# Patient Record
Sex: Female | Born: 1970 | Race: Black or African American | Hispanic: No | Marital: Single | Smoking: Current every day smoker
Health system: Southern US, Community
[De-identification: ages and names within clinical notes are randomized; demographics above are authoritative.]

## PROBLEM LIST (undated history)

## (undated) DIAGNOSIS — G8929 Other chronic pain: Secondary | ICD-10-CM

## (undated) DIAGNOSIS — F419 Anxiety disorder, unspecified: Secondary | ICD-10-CM

## (undated) HISTORY — PX: ANKLE SURGERY: SHX546

---

## 2000-12-31 ENCOUNTER — Encounter: Payer: Self-pay | Admitting: *Deleted

## 2000-12-31 ENCOUNTER — Emergency Department (HOSPITAL_COMMUNITY): Admission: EM | Admit: 2000-12-31 | Discharge: 2000-12-31 | Payer: Self-pay | Admitting: *Deleted

## 2001-05-19 ENCOUNTER — Encounter: Payer: Self-pay | Admitting: Emergency Medicine

## 2001-05-19 ENCOUNTER — Emergency Department (HOSPITAL_COMMUNITY): Admission: EM | Admit: 2001-05-19 | Discharge: 2001-05-20 | Payer: Self-pay | Admitting: Emergency Medicine

## 2001-05-21 ENCOUNTER — Emergency Department (HOSPITAL_COMMUNITY): Admission: EM | Admit: 2001-05-21 | Discharge: 2001-05-21 | Payer: Self-pay | Admitting: Emergency Medicine

## 2001-06-11 ENCOUNTER — Emergency Department (HOSPITAL_COMMUNITY): Admission: EM | Admit: 2001-06-11 | Discharge: 2001-06-11 | Payer: Self-pay | Admitting: Emergency Medicine

## 2002-01-03 ENCOUNTER — Emergency Department (HOSPITAL_COMMUNITY): Admission: EM | Admit: 2002-01-03 | Discharge: 2002-01-03 | Payer: Self-pay

## 2002-03-12 ENCOUNTER — Emergency Department (HOSPITAL_COMMUNITY): Admission: EM | Admit: 2002-03-12 | Discharge: 2002-03-12 | Payer: Self-pay | Admitting: Emergency Medicine

## 2002-03-20 ENCOUNTER — Emergency Department (HOSPITAL_COMMUNITY): Admission: EM | Admit: 2002-03-20 | Discharge: 2002-03-20 | Payer: Self-pay | Admitting: *Deleted

## 2002-07-23 ENCOUNTER — Emergency Department (HOSPITAL_COMMUNITY): Admission: EM | Admit: 2002-07-23 | Discharge: 2002-07-23 | Payer: Self-pay | Admitting: Emergency Medicine

## 2002-07-24 ENCOUNTER — Emergency Department (HOSPITAL_COMMUNITY): Admission: EM | Admit: 2002-07-24 | Discharge: 2002-07-24 | Payer: Self-pay | Admitting: Emergency Medicine

## 2002-08-02 ENCOUNTER — Encounter: Payer: Self-pay | Admitting: *Deleted

## 2002-08-02 ENCOUNTER — Emergency Department (HOSPITAL_COMMUNITY): Admission: EM | Admit: 2002-08-02 | Discharge: 2002-08-02 | Payer: Self-pay | Admitting: *Deleted

## 2002-08-08 ENCOUNTER — Emergency Department (HOSPITAL_COMMUNITY): Admission: EM | Admit: 2002-08-08 | Discharge: 2002-08-08 | Payer: Self-pay | Admitting: Emergency Medicine

## 2002-08-29 ENCOUNTER — Emergency Department (HOSPITAL_COMMUNITY): Admission: EM | Admit: 2002-08-29 | Discharge: 2002-08-29 | Payer: Self-pay | Admitting: Internal Medicine

## 2002-09-28 ENCOUNTER — Inpatient Hospital Stay (HOSPITAL_COMMUNITY): Admission: EM | Admit: 2002-09-28 | Discharge: 2002-09-30 | Payer: Self-pay | Admitting: *Deleted

## 2002-09-28 ENCOUNTER — Encounter: Payer: Self-pay | Admitting: Internal Medicine

## 2002-09-30 ENCOUNTER — Inpatient Hospital Stay (HOSPITAL_COMMUNITY): Admission: EM | Admit: 2002-09-30 | Discharge: 2002-10-01 | Payer: Self-pay | Admitting: *Deleted

## 2002-10-04 ENCOUNTER — Emergency Department (HOSPITAL_COMMUNITY): Admission: EM | Admit: 2002-10-04 | Discharge: 2002-10-04 | Payer: Self-pay | Admitting: Emergency Medicine

## 2002-10-04 ENCOUNTER — Encounter: Payer: Self-pay | Admitting: Emergency Medicine

## 2002-10-23 ENCOUNTER — Emergency Department (HOSPITAL_COMMUNITY): Admission: EM | Admit: 2002-10-23 | Discharge: 2002-10-23 | Payer: Self-pay | Admitting: Emergency Medicine

## 2003-02-09 ENCOUNTER — Emergency Department (HOSPITAL_COMMUNITY): Admission: EM | Admit: 2003-02-09 | Discharge: 2003-02-09 | Payer: Self-pay | Admitting: Emergency Medicine

## 2003-02-16 ENCOUNTER — Emergency Department (HOSPITAL_COMMUNITY): Admission: EM | Admit: 2003-02-16 | Discharge: 2003-02-16 | Payer: Self-pay | Admitting: Emergency Medicine

## 2003-03-24 ENCOUNTER — Emergency Department (HOSPITAL_COMMUNITY): Admission: EM | Admit: 2003-03-24 | Discharge: 2003-03-25 | Payer: Self-pay | Admitting: *Deleted

## 2003-03-29 ENCOUNTER — Emergency Department (HOSPITAL_COMMUNITY): Admission: EM | Admit: 2003-03-29 | Discharge: 2003-03-29 | Payer: Self-pay | Admitting: Internal Medicine

## 2003-04-04 ENCOUNTER — Emergency Department (HOSPITAL_COMMUNITY): Admission: EM | Admit: 2003-04-04 | Discharge: 2003-04-05 | Payer: Self-pay | Admitting: Emergency Medicine

## 2003-05-09 ENCOUNTER — Emergency Department (HOSPITAL_COMMUNITY): Admission: EM | Admit: 2003-05-09 | Discharge: 2003-05-09 | Payer: Self-pay | Admitting: Emergency Medicine

## 2003-05-20 ENCOUNTER — Emergency Department (HOSPITAL_COMMUNITY): Admission: EM | Admit: 2003-05-20 | Discharge: 2003-05-20 | Payer: Self-pay | Admitting: Emergency Medicine

## 2003-06-01 ENCOUNTER — Emergency Department (HOSPITAL_COMMUNITY): Admission: EM | Admit: 2003-06-01 | Discharge: 2003-06-01 | Payer: Self-pay | Admitting: Emergency Medicine

## 2003-06-09 ENCOUNTER — Emergency Department (HOSPITAL_COMMUNITY): Admission: EM | Admit: 2003-06-09 | Discharge: 2003-06-09 | Payer: Self-pay | Admitting: Emergency Medicine

## 2003-06-11 ENCOUNTER — Emergency Department (HOSPITAL_COMMUNITY): Admission: EM | Admit: 2003-06-11 | Discharge: 2003-06-12 | Payer: Self-pay | Admitting: Emergency Medicine

## 2003-09-29 ENCOUNTER — Emergency Department (HOSPITAL_COMMUNITY): Admission: EM | Admit: 2003-09-29 | Discharge: 2003-09-29 | Payer: Self-pay | Admitting: *Deleted

## 2004-07-06 ENCOUNTER — Emergency Department (HOSPITAL_COMMUNITY): Admission: EM | Admit: 2004-07-06 | Discharge: 2004-07-06 | Payer: Self-pay | Admitting: Emergency Medicine

## 2004-11-20 ENCOUNTER — Emergency Department (HOSPITAL_COMMUNITY): Admission: EM | Admit: 2004-11-20 | Discharge: 2004-11-20 | Payer: Self-pay | Admitting: Emergency Medicine

## 2004-12-22 ENCOUNTER — Emergency Department (HOSPITAL_COMMUNITY): Admission: EM | Admit: 2004-12-22 | Discharge: 2004-12-22 | Payer: Self-pay | Admitting: Emergency Medicine

## 2004-12-31 ENCOUNTER — Emergency Department (HOSPITAL_COMMUNITY): Admission: EM | Admit: 2004-12-31 | Discharge: 2004-12-31 | Payer: Self-pay | Admitting: Emergency Medicine

## 2005-01-08 ENCOUNTER — Emergency Department (HOSPITAL_COMMUNITY): Admission: EM | Admit: 2005-01-08 | Discharge: 2005-01-08 | Payer: Self-pay | Admitting: Emergency Medicine

## 2005-09-24 ENCOUNTER — Emergency Department (HOSPITAL_COMMUNITY): Admission: EM | Admit: 2005-09-24 | Discharge: 2005-09-24 | Payer: Self-pay | Admitting: Emergency Medicine

## 2005-11-06 ENCOUNTER — Emergency Department (HOSPITAL_COMMUNITY): Admission: EM | Admit: 2005-11-06 | Discharge: 2005-11-06 | Payer: Self-pay | Admitting: Emergency Medicine

## 2006-04-20 ENCOUNTER — Emergency Department (HOSPITAL_COMMUNITY): Admission: EM | Admit: 2006-04-20 | Discharge: 2006-04-21 | Payer: Self-pay | Admitting: Emergency Medicine

## 2006-10-11 ENCOUNTER — Inpatient Hospital Stay (HOSPITAL_COMMUNITY): Admission: EM | Admit: 2006-10-11 | Discharge: 2006-10-13 | Payer: Self-pay | Admitting: *Deleted

## 2006-10-19 ENCOUNTER — Emergency Department (HOSPITAL_COMMUNITY): Admission: EM | Admit: 2006-10-19 | Discharge: 2006-10-19 | Payer: Self-pay | Admitting: Emergency Medicine

## 2006-12-04 ENCOUNTER — Encounter (HOSPITAL_COMMUNITY): Admission: RE | Admit: 2006-12-04 | Discharge: 2007-01-03 | Payer: Self-pay | Admitting: Orthopaedic Surgery

## 2007-01-13 ENCOUNTER — Emergency Department (HOSPITAL_COMMUNITY): Admission: EM | Admit: 2007-01-13 | Discharge: 2007-01-13 | Payer: Self-pay | Admitting: Emergency Medicine

## 2007-04-24 ENCOUNTER — Emergency Department (HOSPITAL_COMMUNITY): Admission: EM | Admit: 2007-04-24 | Discharge: 2007-04-24 | Payer: Self-pay | Admitting: Emergency Medicine

## 2007-04-29 ENCOUNTER — Emergency Department (HOSPITAL_COMMUNITY): Admission: EM | Admit: 2007-04-29 | Discharge: 2007-04-29 | Payer: Self-pay | Admitting: Emergency Medicine

## 2007-05-07 ENCOUNTER — Inpatient Hospital Stay (HOSPITAL_COMMUNITY): Admission: RE | Admit: 2007-05-07 | Discharge: 2007-05-11 | Payer: Self-pay | Admitting: *Deleted

## 2007-05-07 ENCOUNTER — Ambulatory Visit: Payer: Self-pay | Admitting: *Deleted

## 2007-05-07 ENCOUNTER — Emergency Department (HOSPITAL_COMMUNITY): Admission: EM | Admit: 2007-05-07 | Discharge: 2007-05-07 | Payer: Self-pay | Admitting: Emergency Medicine

## 2007-05-16 ENCOUNTER — Emergency Department (HOSPITAL_COMMUNITY): Admission: EM | Admit: 2007-05-16 | Discharge: 2007-05-16 | Payer: Self-pay | Admitting: Emergency Medicine

## 2007-05-18 ENCOUNTER — Emergency Department (HOSPITAL_COMMUNITY): Admission: EM | Admit: 2007-05-18 | Discharge: 2007-05-18 | Payer: Self-pay | Admitting: Emergency Medicine

## 2007-05-19 ENCOUNTER — Inpatient Hospital Stay (HOSPITAL_COMMUNITY): Admission: AD | Admit: 2007-05-19 | Discharge: 2007-05-19 | Payer: Self-pay | Admitting: Obstetrics and Gynecology

## 2007-06-02 ENCOUNTER — Emergency Department (HOSPITAL_COMMUNITY): Admission: EM | Admit: 2007-06-02 | Discharge: 2007-06-03 | Payer: Self-pay | Admitting: Emergency Medicine

## 2007-06-08 ENCOUNTER — Emergency Department (HOSPITAL_COMMUNITY): Admission: EM | Admit: 2007-06-08 | Discharge: 2007-06-08 | Payer: Self-pay | Admitting: Emergency Medicine

## 2007-09-30 ENCOUNTER — Emergency Department: Payer: Self-pay | Admitting: Emergency Medicine

## 2007-10-01 ENCOUNTER — Emergency Department (HOSPITAL_COMMUNITY): Admission: EM | Admit: 2007-10-01 | Discharge: 2007-10-01 | Payer: Self-pay | Admitting: Emergency Medicine

## 2008-01-14 ENCOUNTER — Emergency Department: Payer: Self-pay | Admitting: Emergency Medicine

## 2008-03-11 ENCOUNTER — Emergency Department (HOSPITAL_COMMUNITY): Admission: EM | Admit: 2008-03-11 | Discharge: 2008-03-11 | Payer: Self-pay | Admitting: Emergency Medicine

## 2008-03-31 ENCOUNTER — Emergency Department (HOSPITAL_COMMUNITY): Admission: EM | Admit: 2008-03-31 | Discharge: 2008-03-31 | Payer: Self-pay | Admitting: Emergency Medicine

## 2008-06-14 ENCOUNTER — Emergency Department: Payer: Self-pay | Admitting: Emergency Medicine

## 2008-08-26 ENCOUNTER — Emergency Department (HOSPITAL_COMMUNITY): Admission: EM | Admit: 2008-08-26 | Discharge: 2008-08-26 | Payer: Self-pay | Admitting: Emergency Medicine

## 2009-01-26 ENCOUNTER — Emergency Department (HOSPITAL_COMMUNITY): Admission: EM | Admit: 2009-01-26 | Discharge: 2009-01-26 | Payer: Self-pay | Admitting: Emergency Medicine

## 2009-04-04 ENCOUNTER — Emergency Department (HOSPITAL_COMMUNITY): Admission: EM | Admit: 2009-04-04 | Discharge: 2009-04-04 | Payer: Self-pay | Admitting: Emergency Medicine

## 2009-07-23 ENCOUNTER — Emergency Department (HOSPITAL_COMMUNITY): Admission: EM | Admit: 2009-07-23 | Discharge: 2009-07-23 | Payer: Self-pay | Admitting: Emergency Medicine

## 2009-08-18 ENCOUNTER — Emergency Department (HOSPITAL_COMMUNITY): Admission: EM | Admit: 2009-08-18 | Discharge: 2009-08-18 | Payer: Self-pay | Admitting: Emergency Medicine

## 2009-08-23 ENCOUNTER — Emergency Department (HOSPITAL_COMMUNITY): Admission: EM | Admit: 2009-08-23 | Discharge: 2009-08-23 | Payer: Self-pay | Admitting: Emergency Medicine

## 2009-12-08 IMAGING — CR DG CHEST 2V
2 series · 2 of 2 positions shown · non-contrast
Comparison: 10/11/2006

CLINICAL DATA: Cough, chest pain, shortness of breath

CHEST - 2 VIEW:

[w chest pa]
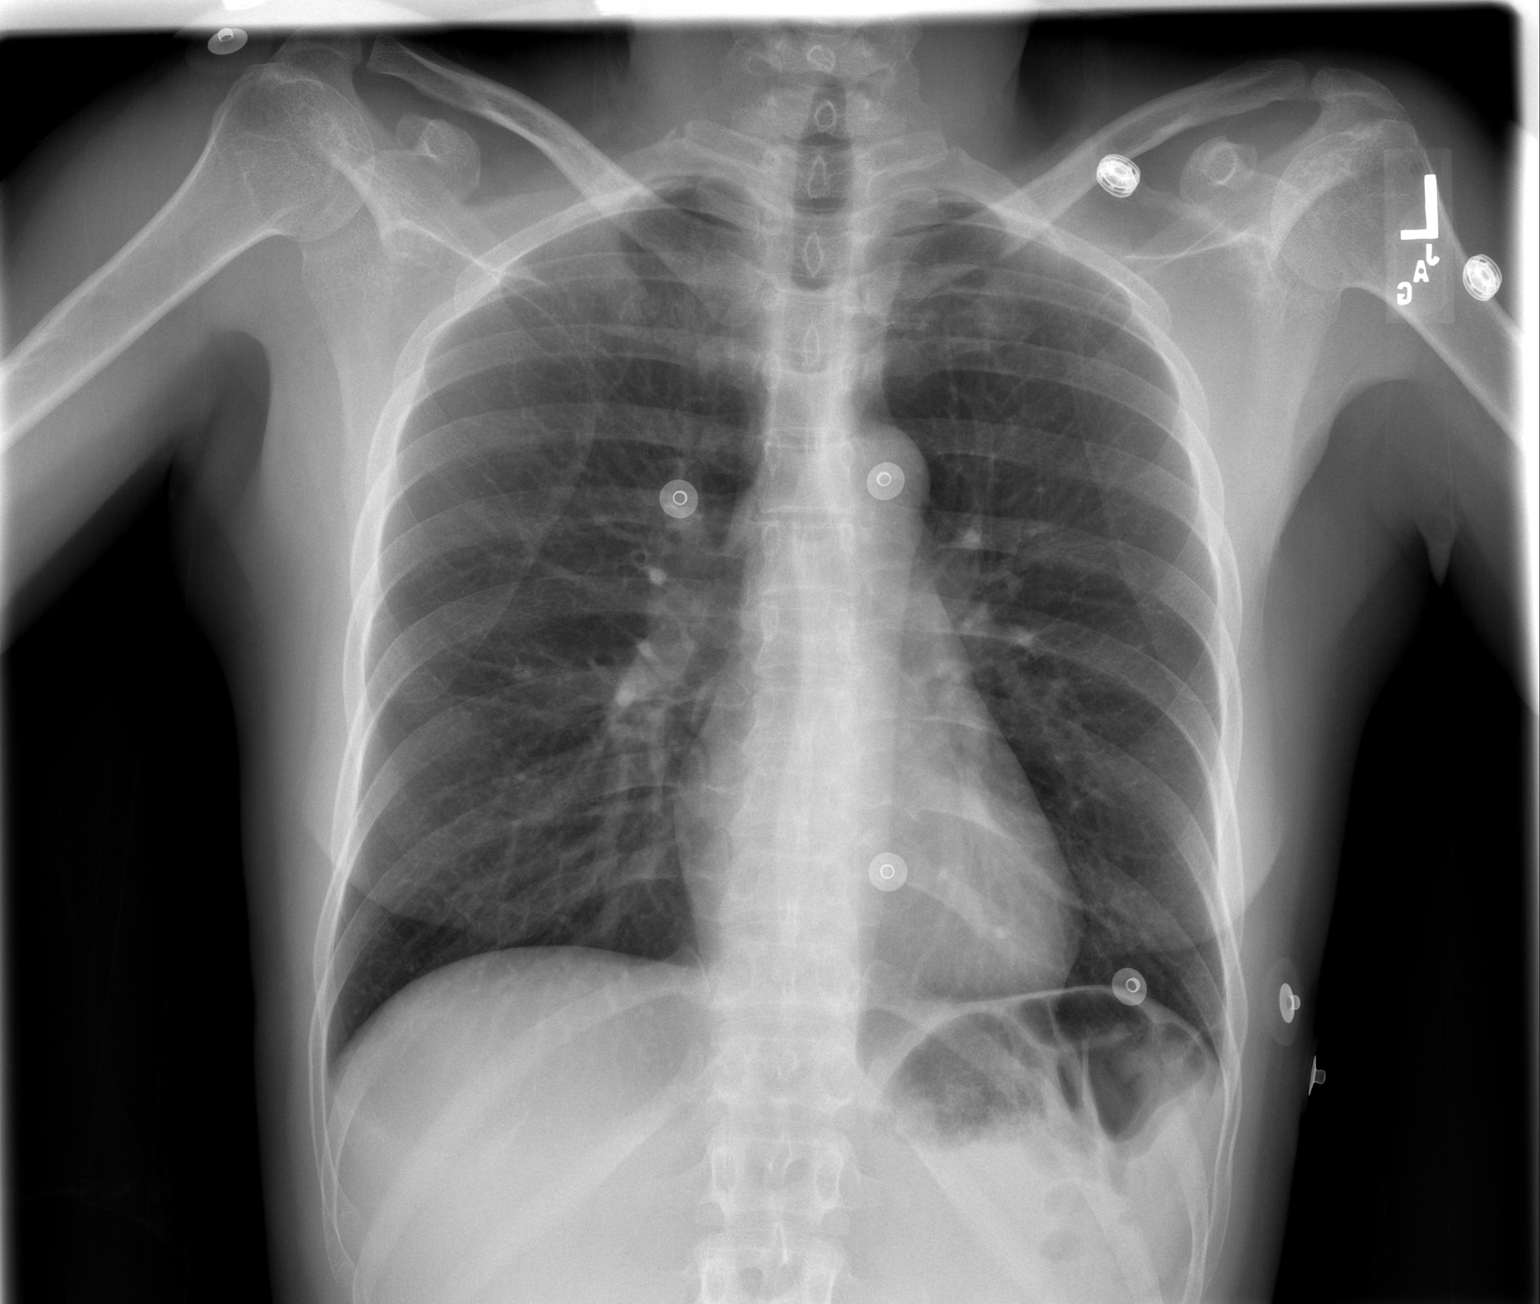

[w chest lat]
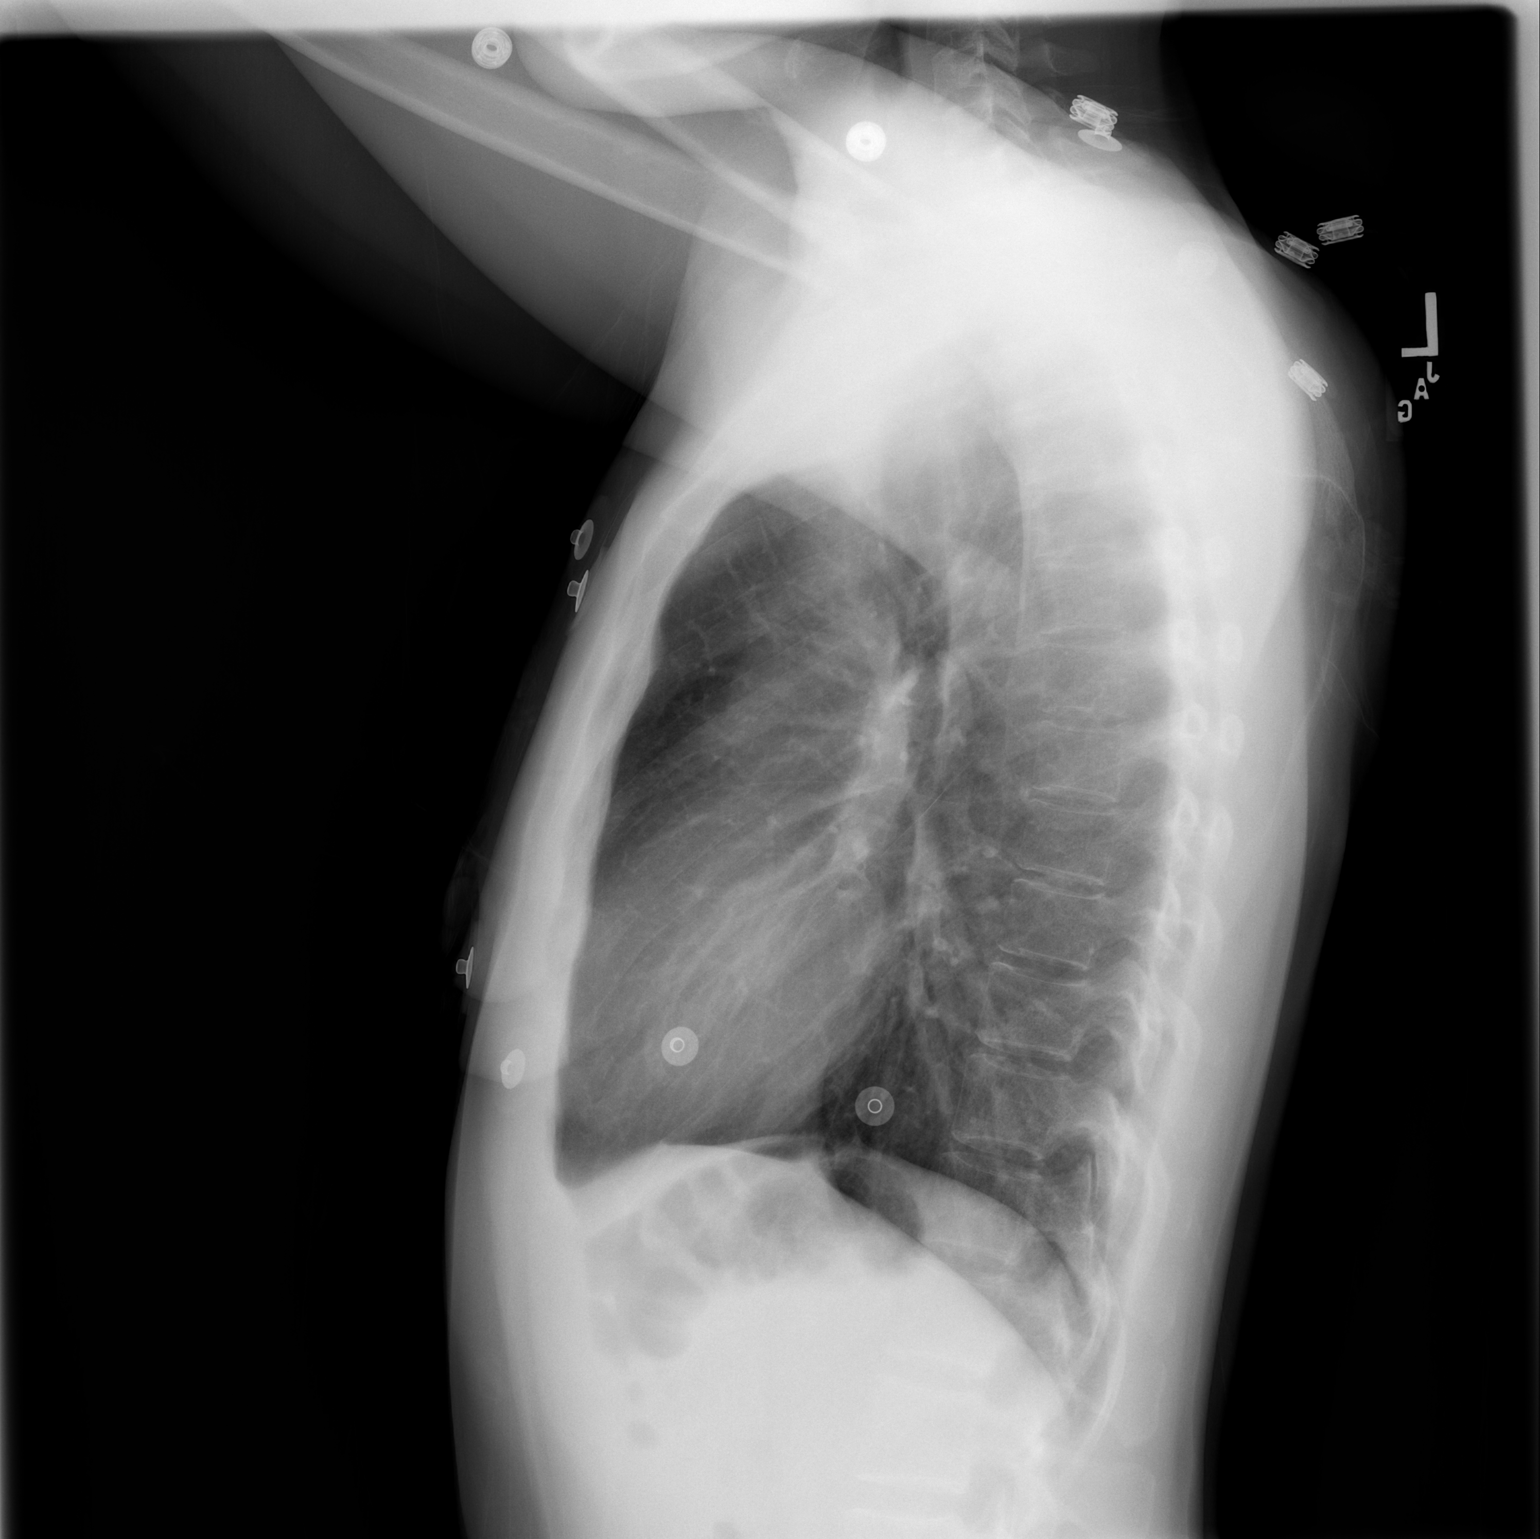

[2 of 2 positions shown; findings below may reference images not displayed]

FINDINGS: The heart size and mediastinal contours are within normal limits. 
Mild peribronchial thickening noted. No focal opacities. The visualized skeletal
structures are unremarkable.
IMPRESSION: Mild bronchitic changes.

## 2010-01-31 ENCOUNTER — Emergency Department (HOSPITAL_COMMUNITY)
Admission: EM | Admit: 2010-01-31 | Discharge: 2010-01-31 | Payer: Self-pay | Source: Home / Self Care | Admitting: Emergency Medicine

## 2010-03-21 ENCOUNTER — Emergency Department (HOSPITAL_COMMUNITY)
Admission: EM | Admit: 2010-03-21 | Discharge: 2010-03-21 | Payer: Self-pay | Source: Home / Self Care | Admitting: Emergency Medicine

## 2010-04-19 ENCOUNTER — Emergency Department (HOSPITAL_COMMUNITY)
Admission: EM | Admit: 2010-04-19 | Discharge: 2010-04-19 | Disposition: A | Discharge status: Home / Self Care | Payer: Self-pay | Attending: Emergency Medicine | Admitting: Emergency Medicine

## 2010-04-19 DIAGNOSIS — M25579 Pain in unspecified ankle and joints of unspecified foot: Secondary | ICD-10-CM | POA: Insufficient documentation

## 2010-05-13 LAB — DIFFERENTIAL
Basophils Absolute: 0 10*3/uL (ref 0.0–0.1)
Eosinophils Relative: 0 % (ref 0–5)
Lymphocytes Relative: 23 % (ref 12–46)
Neutro Abs: 7.3 10*3/uL (ref 1.7–7.7)
Neutrophils Relative %: 70 % (ref 43–77)

## 2010-05-13 LAB — POCT I-STAT, CHEM 8
BUN: 11 mg/dL (ref 6–23)
Creatinine, Ser: 0.7 mg/dL (ref 0.4–1.2)
Glucose, Bld: 79 mg/dL (ref 70–99)
Potassium: 3.4 mEq/L — ABNORMAL LOW (ref 3.5–5.1)
Sodium: 141 mEq/L (ref 135–145)
TCO2: 25 mmol/L (ref 0–100)

## 2010-05-13 LAB — BASIC METABOLIC PANEL
BUN: 7 mg/dL (ref 6–23)
Calcium: 8.5 mg/dL (ref 8.4–10.5)
Calcium: 9.2 mg/dL (ref 8.4–10.5)
Creatinine, Ser: 0.63 mg/dL (ref 0.4–1.2)
GFR calc Af Amer: >60 mL/min (ref >60)
GFR calc Af Amer: >60 mL/min (ref >60)
GFR calc non Af Amer: >60 mL/min (ref >60)
GFR calc non Af Amer: >60 mL/min (ref >60)
Glucose, Bld: 83 mg/dL (ref 70–99)
Potassium: 3.4 mEq/L — ABNORMAL LOW (ref 3.5–5.1)
Sodium: 141 mEq/L (ref 135–145)

## 2010-05-13 LAB — URINALYSIS, ROUTINE W REFLEX MICROSCOPIC
Glucose, UA: NEGATIVE mg/dL
Nitrite: NEGATIVE
Specific Gravity, Urine: >1.03 — ABNORMAL HIGH (ref 1.005–1.030)
Urobilinogen, UA: 1 mg/dL (ref 0.0–1.0)
pH: 5.5 (ref 5.0–8.0)
pH: 6 (ref 5.0–8.0)

## 2010-05-13 LAB — CK TOTAL AND CKMB (NOT AT ARMC)
CK, MB: 1.2 ng/mL (ref 0.3–4.0)
Relative Index: 0.6 (ref 0.0–2.5)
Total CK: 205 U/L — ABNORMAL HIGH (ref 7–177)

## 2010-05-13 LAB — CBC
Platelets: 147 10*3/uL — ABNORMAL LOW (ref 150–400)
RBC: 3.71 MIL/uL — ABNORMAL LOW (ref 3.87–5.11)
RDW: 13.7 % (ref 11.5–15.5)
WBC: 10.4 10*3/uL (ref 4.0–10.5)

## 2010-05-13 LAB — URINE MICROSCOPIC-ADD ON

## 2010-05-13 LAB — RAPID URINE DRUG SCREEN, HOSP PERFORMED
Amphetamines: NOT DETECTED
Barbiturates: NOT DETECTED
Benzodiazepines: NOT DETECTED
Tetrahydrocannabinol: NOT DETECTED

## 2010-05-16 LAB — POCT I-STAT, CHEM 8
Calcium, Ion: 1.16 mmol/L (ref 1.12–1.32)
Chloride: 105 mEq/L (ref 96–112)
Creatinine, Ser: 0.7 mg/dL (ref 0.4–1.2)
Glucose, Bld: 104 mg/dL — ABNORMAL HIGH (ref 70–99)
Potassium: 2.7 mEq/L — CL (ref 3.5–5.1)

## 2010-06-12 LAB — POCT I-STAT, CHEM 8
BUN: 15 mg/dL (ref 6–23)
Chloride: 102 mEq/L (ref 96–112)
Creatinine, Ser: 1.1 mg/dL (ref 0.4–1.2)
Potassium: 2.8 mEq/L — ABNORMAL LOW (ref 3.5–5.1)
Sodium: 140 mEq/L (ref 135–145)
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
## 2010-07-13 NOTE — Consult Note (Signed)
   NAME:  Tina Trevino, Tina Trevino                        ACCOUNT NO.:  [id number]   MEDICAL RECORD NO.:  [id number]                   PATIENT TYPE:  INP   LOCATION:  A339                                 FACILITY:  [facility]   PHYSICIAN:  Hanley Hays. Dechurch, M.D.           DATE OF BIRTH:  Aug 10, 1970   DATE OF CONSULTATION:  DATE OF DISCHARGE:  10/01/2002                                   CONSULTATION   DIAGNOSES:  1. Cellulitis and abscess of right fifth metatarsal and foot.  2. Cocaine abuse.   DISPOSITION:  Patient discharged to home on Augment 875 b.i.d. for five more  days, warm soaks to the right foot.  The patient is instructed to return  should there be fever, swelling or worsening of the foot.  Patient referred  to [facility] for outpatient followup as arranged by  [[facility]] team.  Dagoberto Reef of Court notified regarding the patient's release from  hospital.   HOSPITAL COURSE:  A 40 year old [place] American female who presented with a  fulminant cellulitis of the right foot.  She was treated with parenteral  Unison with some improvement.  The right foot abscess spontaneously drained.  The foot was markedly improved on the day of discharge.  The evening prior  to discharge, the patient apparently disappeared from the hospital and was  readmitted to the emergency room and low and behold had positive drug screen  for cocaine.  The patient was counseled.  She said she was going to follow  with mental health, but clearly this was not going to be the case.  The plan  was discussed at length with her mother who has little ability to control  this patient's behavior.  Given the fact that medically the foot was stable,  she is being discharged to home.  She will follow up with Dr. Lodema Hong next  week.                                               Hanley Hays Josefine Class, M.D.    FED/MEDQ  D:  10/01/2002  T:  10/01/2002  Job:  [id number]

## 2010-08-20 ENCOUNTER — Emergency Department (HOSPITAL_COMMUNITY): Payer: Self-pay

## 2010-08-20 ENCOUNTER — Emergency Department (HOSPITAL_COMMUNITY)
Admission: EM | Admit: 2010-08-20 | Discharge: 2010-08-20 | Disposition: A | Discharge status: Home / Self Care | Payer: Self-pay | Attending: Emergency Medicine | Admitting: Emergency Medicine

## 2010-08-20 DIAGNOSIS — R079 Chest pain, unspecified: Secondary | ICD-10-CM | POA: Insufficient documentation

## 2010-08-27 ENCOUNTER — Emergency Department (HOSPITAL_COMMUNITY)
Admission: EM | Admit: 2010-08-27 | Discharge: 2010-08-29 | Disposition: A | Discharge status: Home / Self Care | Payer: Self-pay | Attending: Emergency Medicine | Admitting: Emergency Medicine

## 2010-08-27 DIAGNOSIS — F329 Major depressive disorder, single episode, unspecified: Secondary | ICD-10-CM | POA: Insufficient documentation

## 2010-08-27 DIAGNOSIS — F3289 Other specified depressive episodes: Secondary | ICD-10-CM | POA: Insufficient documentation

## 2010-08-27 DIAGNOSIS — F1411 Cocaine abuse, in remission: Secondary | ICD-10-CM | POA: Insufficient documentation

## 2010-08-27 DIAGNOSIS — F909 Attention-deficit hyperactivity disorder, unspecified type: Secondary | ICD-10-CM | POA: Insufficient documentation

## 2010-08-27 DIAGNOSIS — F411 Generalized anxiety disorder: Secondary | ICD-10-CM | POA: Insufficient documentation

## 2010-08-27 DIAGNOSIS — F1911 Other psychoactive substance abuse, in remission: Secondary | ICD-10-CM | POA: Insufficient documentation

## 2010-08-27 DIAGNOSIS — F17200 Nicotine dependence, unspecified, uncomplicated: Secondary | ICD-10-CM | POA: Insufficient documentation

## 2010-08-27 LAB — URINALYSIS, ROUTINE W REFLEX MICROSCOPIC
Glucose, UA: NEGATIVE mg/dL
Protein, ur: NEGATIVE mg/dL
Specific Gravity, Urine: 1.025 (ref 1.005–1.030)
pH: 6.5 (ref 5.0–8.0)

## 2010-08-27 LAB — BASIC METABOLIC PANEL
CO2: 27 mEq/L (ref 19–32)
Calcium: 9.4 mg/dL (ref 8.4–10.5)
Glucose, Bld: 114 mg/dL — ABNORMAL HIGH (ref 70–99)
Potassium: 3.7 mEq/L (ref 3.5–5.1)
Sodium: 139 mEq/L (ref 135–145)

## 2010-08-27 LAB — DIFFERENTIAL
Basophils Absolute: 0 10*3/uL (ref 0.0–0.1)
Lymphocytes Relative: 44 % (ref 12–46)
Monocytes Absolute: 0.4 10*3/uL (ref 0.1–1.0)
Neutro Abs: 2.2 10*3/uL (ref 1.7–7.7)

## 2010-08-27 LAB — CBC
HCT: 39 % (ref 36.0–46.0)
Hemoglobin: 12.9 g/dL (ref 12.0–15.0)
MCHC: 33.1 g/dL (ref 30.0–36.0)

## 2010-08-27 LAB — URINE MICROSCOPIC-ADD ON

## 2010-08-27 LAB — RAPID URINE DRUG SCREEN, HOSP PERFORMED
Opiates: NOT DETECTED
Tetrahydrocannabinol: NOT DETECTED

## 2010-08-31 LAB — URINE CULTURE
Colony Count: >=100000
Culture  Setup Time: 201207030225

## 2010-11-16 LAB — CBC
MCHC: 33.5
RDW: 14.8

## 2010-11-16 LAB — RAPID URINE DRUG SCREEN, HOSP PERFORMED
Amphetamines: NOT DETECTED
Barbiturates: NOT DETECTED
Benzodiazepines: NOT DETECTED
Opiates: NOT DETECTED

## 2010-11-16 LAB — BASIC METABOLIC PANEL
BUN: 6
CO2: 24
Calcium: 9.9
Creatinine, Ser: 0.82
Glucose, Bld: 74

## 2010-11-16 LAB — URINALYSIS, ROUTINE W REFLEX MICROSCOPIC
Glucose, UA: NEGATIVE
Ketones, ur: >80 — AB
Leukocytes, UA: NEGATIVE
Protein, ur: 30 — AB
pH: 6

## 2010-11-16 LAB — URINE MICROSCOPIC-ADD ON

## 2010-11-16 LAB — DIFFERENTIAL
Eosinophils Absolute: 0
Eosinophils Relative: 1
Lymphocytes Relative: 36
Lymphs Abs: 1.8
Monocytes Relative: 9

## 2010-11-19 LAB — DIFFERENTIAL
Eosinophils Absolute: 0
Eosinophils Relative: 1
Lymphocytes Relative: 42
Lymphocytes Relative: 17
Lymphs Abs: 1.9
Lymphs Abs: 1.5
Monocytes Absolute: 0.4
Monocytes Relative: 9
Monocytes Relative: 12
Neutro Abs: 6.2
Neutrophils Relative %: 71

## 2010-11-19 LAB — URINALYSIS, ROUTINE W REFLEX MICROSCOPIC
Bilirubin Urine: NEGATIVE
Nitrite: NEGATIVE
Specific Gravity, Urine: 1.017
Urobilinogen, UA: 1
pH: 6

## 2010-11-19 LAB — CBC
HCT: 39.3
Hemoglobin: 13.3
MCV: 90.3
Platelets: 211
RBC: 4.42
RBC: 3.69 — ABNORMAL LOW
RDW: 13.8
WBC: 4.5
WBC: 8.8

## 2010-11-19 LAB — RAPID URINE DRUG SCREEN, HOSP PERFORMED
Barbiturates: NOT DETECTED
Barbiturates: NOT DETECTED
Benzodiazepines: NOT DETECTED
Cocaine: POSITIVE — AB
Cocaine: POSITIVE — AB
Opiates: NOT DETECTED

## 2010-11-19 LAB — I-STAT 8, (EC8 V) (CONVERTED LAB)
Acid-base deficit: 1
BUN: 9
BUN: 5 — ABNORMAL LOW
Chloride: 105
Chloride: 108
HCT: 40
Operator id: 265201
Potassium: 3.3 — ABNORMAL LOW
pCO2, Ven: 52.1 — ABNORMAL HIGH
pCO2, Ven: 46
pH, Ven: 7.352 — ABNORMAL HIGH

## 2010-11-19 LAB — URINE CULTURE

## 2010-11-19 LAB — URINE MICROSCOPIC-ADD ON

## 2010-11-19 LAB — HEPATIC FUNCTION PANEL
AST: 15
Albumin: 3.7
Bilirubin, Direct: <0.1

## 2010-11-19 LAB — RPR
RPR Ser Ql: NONREACTIVE
RPR Ser Ql: NONREACTIVE

## 2010-11-19 LAB — POCT I-STAT CREATININE
Creatinine, Ser: 0.9
Operator id: 146091

## 2010-11-19 LAB — TSH: TSH: 0.56

## 2010-11-19 LAB — POCT PREGNANCY, URINE
Operator id: 151321
Preg Test, Ur: NEGATIVE

## 2010-11-19 LAB — POCT I-STAT, CHEM 8
Calcium, Ion: 0.95 — ABNORMAL LOW
Chloride: 106
Glucose, Bld: 96
HCT: 36
Hemoglobin: 12.2
Potassium: 3.4 — ABNORMAL LOW

## 2010-11-19 LAB — GC/CHLAMYDIA PROBE AMP, GENITAL: GC Probe Amp, Genital: NEGATIVE

## 2010-11-19 LAB — ETHANOL: Alcohol, Ethyl (B): 6

## 2010-11-19 LAB — WET PREP, GENITAL

## 2010-11-20 LAB — CBC
HCT: 37
MCV: 88.9
Platelets: 196
RDW: 14.4

## 2010-11-20 LAB — DIFFERENTIAL
Basophils Absolute: 0
Basophils Relative: 1
Eosinophils Absolute: 0
Eosinophils Relative: 1

## 2010-11-20 LAB — BASIC METABOLIC PANEL
BUN: 13
Chloride: 100
GFR calc non Af Amer: >60
Glucose, Bld: 92
Potassium: 3.3 — ABNORMAL LOW

## 2010-11-20 LAB — RAPID URINE DRUG SCREEN, HOSP PERFORMED
Cocaine: POSITIVE — AB
Opiates: NOT DETECTED
Tetrahydrocannabinol: NOT DETECTED

## 2010-11-23 LAB — CBC
HCT: 34.6 — ABNORMAL LOW
Hemoglobin: 11.5 — ABNORMAL LOW
MCHC: 33.3
RDW: 13.8

## 2010-11-23 LAB — DIFFERENTIAL
Basophils Absolute: 0
Basophils Relative: 0
Eosinophils Relative: 1
Lymphocytes Relative: 32
Monocytes Absolute: 0.4
Monocytes Relative: 8

## 2010-11-23 LAB — RAPID URINE DRUG SCREEN, HOSP PERFORMED
Barbiturates: NOT DETECTED
Benzodiazepines: NOT DETECTED

## 2010-11-23 LAB — URINE MICROSCOPIC-ADD ON

## 2010-11-23 LAB — BASIC METABOLIC PANEL
CO2: 27
Glucose, Bld: 98
Potassium: 3.3 — ABNORMAL LOW
Sodium: 137

## 2010-11-23 LAB — URINALYSIS, ROUTINE W REFLEX MICROSCOPIC
Glucose, UA: NEGATIVE
pH: 6.5

## 2010-11-23 LAB — CK TOTAL AND CKMB (NOT AT ARMC): Relative Index: INVALID

## 2010-12-07 LAB — RAPID URINE DRUG SCREEN, HOSP PERFORMED
Amphetamines: NOT DETECTED
Barbiturates: NOT DETECTED
Benzodiazepines: NOT DETECTED
Cocaine: POSITIVE — AB
Opiates: POSITIVE — AB
Tetrahydrocannabinol: NOT DETECTED

## 2010-12-07 LAB — CROSSMATCH
ABO/RH(D): O POS
Antibody Screen: NEGATIVE

## 2010-12-07 LAB — URINALYSIS, ROUTINE W REFLEX MICROSCOPIC
Glucose, UA: NEGATIVE
Leukocytes, UA: NEGATIVE
Nitrite: NEGATIVE
Specific Gravity, Urine: 1.025
pH: 6

## 2010-12-07 LAB — BASIC METABOLIC PANEL
CO2: 31
Calcium: 9.6
Chloride: 101
Creatinine, Ser: 0.77
Glucose, Bld: 130 — ABNORMAL HIGH

## 2010-12-07 LAB — URINE MICROSCOPIC-ADD ON

## 2010-12-07 LAB — PROTIME-INR
INR: 1.2
Prothrombin Time: 15.5 — ABNORMAL HIGH

## 2010-12-07 LAB — CBC
MCHC: 34.2
MCV: 89.6
RBC: 3.74 — ABNORMAL LOW
RDW: 13

## 2010-12-07 LAB — DIFFERENTIAL
Basophils Absolute: 0
Basophils Relative: 0
Eosinophils Absolute: 0
Monocytes Absolute: 0.7
Monocytes Relative: 7
Neutro Abs: 7.4
Neutrophils Relative %: 78 — ABNORMAL HIGH

## 2010-12-07 LAB — ABO/RH: ABO/RH(D): O POS

## 2010-12-07 LAB — PREGNANCY, URINE: Preg Test, Ur: NEGATIVE

## 2010-12-07 LAB — SEDIMENTATION RATE: Sed Rate: 12

## 2010-12-10 ENCOUNTER — Emergency Department (HOSPITAL_COMMUNITY)
Admission: EM | Admit: 2010-12-10 | Discharge: 2010-12-10 | Disposition: A | Discharge status: Home / Self Care | Payer: Self-pay | Attending: Emergency Medicine | Admitting: Emergency Medicine

## 2010-12-10 ENCOUNTER — Emergency Department (HOSPITAL_COMMUNITY): Payer: Self-pay

## 2010-12-10 ENCOUNTER — Encounter: Payer: Self-pay | Admitting: *Deleted

## 2010-12-10 DIAGNOSIS — K089 Disorder of teeth and supporting structures, unspecified: Secondary | ICD-10-CM | POA: Insufficient documentation

## 2010-12-10 DIAGNOSIS — M25579 Pain in unspecified ankle and joints of unspecified foot: Secondary | ICD-10-CM | POA: Insufficient documentation

## 2010-12-10 DIAGNOSIS — K029 Dental caries, unspecified: Secondary | ICD-10-CM | POA: Insufficient documentation

## 2010-12-10 DIAGNOSIS — F17200 Nicotine dependence, unspecified, uncomplicated: Secondary | ICD-10-CM | POA: Insufficient documentation

## 2010-12-10 DIAGNOSIS — G8929 Other chronic pain: Secondary | ICD-10-CM | POA: Insufficient documentation

## 2010-12-10 DIAGNOSIS — K0889 Other specified disorders of teeth and supporting structures: Secondary | ICD-10-CM

## 2010-12-10 HISTORY — DX: Other chronic pain: G89.29

## 2010-12-10 MED ORDER — HYDROCODONE-ACETAMINOPHEN 5-325 MG PO TABS: ORAL_TABLET | ORAL | Status: AC

## 2010-12-10 MED ORDER — PENICILLIN V POTASSIUM 250 MG PO TABS: 250.0000 mg | ORAL_TABLET | Freq: Four times a day (QID) | ORAL | Status: AC

## 2010-12-10 NOTE — ED Provider Notes (Signed)
This chart was scribed for Tina Anger, DO by Clarita Crane. The patient was seen in room APA19/APA19 and the patient's care was started at 7:45AM.   CSN: [id number] Arrival date & time: 12/10/2010  6:54 AM  Chief Complaint  Patient presents with  . Dental Pain  . Ankle Pain    HPI Tina Trevino is a 40 y.o. female seen at 7:57 AM. Per pt, c/o gradual onset and persistence of constant left upper tooth "pain" x1 week.  Also c/o gradual onset and persistence of constant acute flair of her chronic right ankle "pain" that began several days ago when she ran out of hydrocodone.  Denies any change in her usual chronic pain pattern, no new injury. Denies fevers, no intra-oral edema, no rash, no facial swelling, no dysphagia, no neck pain.   The condition is aggravated by nothing. The condition is relieved by nothing. The symptoms have been associated with no other complaints. The patient has no significant history of serious medical conditions.    No dentist No PCP Ortho:  Dr. Hilda Lias Past Medical History  Diagnosis Date  . Chronic pain     Past Surgical History  Procedure Date  . Ankle surgery     Family History  Problem Relation Age of Onset  . Diabetes Mother   . Hypertension Sister     History  Substance Use Topics  . Smoking status: Current Everyday Smoker    Types: Cigarettes  . Smokeless tobacco: Not on file  . Alcohol Use: No     Review of Systems ROS: Statement: All systems negative except as marked or noted in the HPI; Constitutional: Negative for fever and chills. ; ; Eyes: Negative for eye pain and discharge. ; ; ENMT: Positive for dental caries, dental hygiene poor and toothache. Negative for ear pain, bleeding gums, dental injury, facial deformity, facial swelling, hoarseness, nasal congestion, sinus pressure, sore throat, throat swelling and tongue swollen. ; ; Cardiovascular: Negative for chest pain, palpitations, diaphoresis, dyspnea and peripheral  edema. ; ; Respiratory: Negative for cough, wheezing and stridor. ; ; Gastrointestinal: Negative for nausea, vomiting, diarrhea and abdominal pain. ; ; Genitourinary: Negative for dysuria, flank pain and hematuria. ; ; Musculoskeletal: Negative for back pain and neck pain. +chronic right ankle pain ; ; Skin: Negative for rash and skin lesion. ; ; Neuro: Negative for headache, lightheadedness and neck stiffness. ;    Allergies  Review of patient's allergies indicates no known allergies.  Home Medications   Current Outpatient Rx  Name Route Sig Dispense Refill  . HYDROCODONE-ACETAMINOPHEN 7.5-325 MG PO TABS Oral Take 1 tablet by mouth every 6 (six) hours as needed.    RAN OUT    BP 106/65  Pulse 67  Temp(Src) 97.6 F (36.4 C) (Oral)  Resp 20  Ht 5' (1.524 m)  Wt 106 lb 7 oz (48.28 kg)  BMI 20.79 kg/m2  SpO2 100%  LMP 11/25/2010  Physical Exam 0745: Physical examination: Vital signs and O2 SAT: Reviewed; Constitutional: Well developed, Well nourished, Well hydrated, In no acute distress; Head and Face: Normocephalic, Atraumatic; Eyes: EOMI, PERRL, No scleral icterus; ENMT: Mouth and pharynx normal, Poor dentition, Widespread dental decay, Left TM normal, Right TM normal, Mucous membranes moist, +upper left 2nd molar with extensive dental decay and multiple missing teeth.  No gingival erythema, edema, fluctuance, or drainage.  No hoarse voice, no drooling, no stridor.  ; Neck: Supple, Full range of motion, No lymphadenopathy; Cardiovascular: Regular rate and  rhythm, No murmur, rub, or gallop; Respiratory: Breath sounds clear & equal bilaterally, No rales, rhonchi, wheezes, or rub, Normal respiratory effort/excursion; Chest: Nontender, Movement normal; Extremities: Pulses normal, No tenderness, No edema, +mild generalized tenderness to palp right lateral and medial maleolar areas without localized edema, erythema, ecchymosis.  NMS intact right foot, strong pedal pp, LE muscle compartments soft.   No right proximal fibular head tenderness, no knee tenderness.  +plantarflexion of right foot w/calf squeeze.  No palpable gap right Achilles's tendon. ; Neuro: AA&Ox3, Major CN grossly intact.  No gross focal motor or sensory deficits in extremities.; Skin: Color normal, No rash, No petechiae, Warm, Dry   ED Course  Procedures   MDM  MDM Reviewed: nursing note and vitals Interpretation: x-ray   Dg Ankle Complete Right  12/10/2010  *RADIOLOGY REPORT*  Clinical Data: Right ankle pain.  Prior surgery.  RIGHT ANKLE - COMPLETE 3+ VIEW  Comparison: 03/21/2010  Findings: Screw and K-wire are present in the medial malleolus. The fracture is well healed.  The tip of a K-wire slightly protrudes from the posterior cortex of the distal tibia, unchanged.  The ankle is otherwise normal.  IMPRESSION: No acute abnormality.  Postsurgical changes as described.  Original Report Authenticated By: Gwynn Burly, M.D.    8:02 AM:  Dx testing d/w pt and family.  Questions answered.  Verb understanding, agreeable to d/c home with outpt f/u.    New Prescriptions   HYDROCODONE-ACETAMINOPHEN (NORCO) 5-325 MG PER TABLET    1 or 2 tabs PO q6h prn pain   PENICILLIN V POTASSIUM (VEETID) 250 MG TABLET    Take 1 tablet (250 mg total) by mouth 4 (four) times daily.     [facility]  I personally performed the services described in this documentation, which was scribed in my presence. The recorded information has been reviewed and considered.       Tina Anger, DO 12/12/10 Serena Croissant

## 2010-12-10 NOTE — ED Notes (Signed)
Pt c/o toothache and right ankle pain.

## 2011-04-01 ENCOUNTER — Encounter (HOSPITAL_COMMUNITY): Payer: Self-pay

## 2011-04-01 ENCOUNTER — Emergency Department (HOSPITAL_COMMUNITY)
Admission: EM | Admit: 2011-04-01 | Discharge: 2011-04-01 | Disposition: A | Discharge status: Home / Self Care | Payer: Self-pay | Attending: Emergency Medicine | Admitting: Emergency Medicine

## 2011-04-01 DIAGNOSIS — G8929 Other chronic pain: Secondary | ICD-10-CM | POA: Insufficient documentation

## 2011-04-01 DIAGNOSIS — F1910 Other psychoactive substance abuse, uncomplicated: Secondary | ICD-10-CM

## 2011-04-01 DIAGNOSIS — F329 Major depressive disorder, single episode, unspecified: Secondary | ICD-10-CM | POA: Insufficient documentation

## 2011-04-01 DIAGNOSIS — F17200 Nicotine dependence, unspecified, uncomplicated: Secondary | ICD-10-CM | POA: Insufficient documentation

## 2011-04-01 DIAGNOSIS — F1410 Cocaine abuse, uncomplicated: Secondary | ICD-10-CM | POA: Insufficient documentation

## 2011-04-01 DIAGNOSIS — F1210 Cannabis abuse, uncomplicated: Secondary | ICD-10-CM | POA: Insufficient documentation

## 2011-04-01 HISTORY — DX: Anxiety disorder, unspecified: F41.9

## 2011-04-01 LAB — CBC
MCV: 90.6 fL (ref 78.0–100.0)
Platelets: 195 10*3/uL (ref 150–400)
RDW: 14 % (ref 11.5–15.5)
WBC: 3.7 10*3/uL — ABNORMAL LOW (ref 4.0–10.5)

## 2011-04-01 LAB — URINALYSIS, ROUTINE W REFLEX MICROSCOPIC
Glucose, UA: NEGATIVE mg/dL
Leukocytes, UA: NEGATIVE
Nitrite: NEGATIVE
Protein, ur: NEGATIVE mg/dL
Urobilinogen, UA: 0.2 mg/dL (ref 0.0–1.0)

## 2011-04-01 LAB — DIFFERENTIAL
Basophils Absolute: 0 10*3/uL (ref 0.0–0.1)
Eosinophils Relative: 1 % (ref 0–5)
Lymphocytes Relative: 47 % — ABNORMAL HIGH (ref 12–46)

## 2011-04-01 LAB — BASIC METABOLIC PANEL
CO2: 28 mEq/L (ref 19–32)
Calcium: 9.4 mg/dL (ref 8.4–10.5)
GFR calc Af Amer: >90 mL/min (ref >90)
GFR calc non Af Amer: >90 mL/min (ref >90)
Sodium: 142 mEq/L (ref 135–145)

## 2011-04-01 LAB — RAPID URINE DRUG SCREEN, HOSP PERFORMED: Opiates: NOT DETECTED

## 2011-04-01 MED ORDER — POTASSIUM CHLORIDE CRYS ER 20 MEQ PO TBCR
40.0000 meq | EXTENDED_RELEASE_TABLET | Freq: Once | ORAL | Status: AC
  Administered 2011-04-01: 40 meq via ORAL
  Filled 2011-04-01: qty 2

## 2011-04-01 NOTE — ED Notes (Signed)
"   i want to talk to Tina Trevino", I feel like im gonna flip out", denies any si/hi, has been sick for 2 weeks.
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
## 2011-04-01 NOTE — ED Notes (Signed)
Tommy with ACT team here to talk with pt.

## 2011-04-01 NOTE — ED Notes (Signed)
Pt denies si/hi when asked, states repeatedly that she "jsut wants to go off on someone"

## 2011-04-01 NOTE — ED Notes (Signed)
Pt wanded by security. 

## 2011-04-01 NOTE — ED Provider Notes (Signed)
History   This chart was scribed for Tina Gaskins, MD by Sofie Rower. The patient was seen in room APAH8/AP[facility]8 and the patient's care was started at 3:26PM.    CSN: [id number]  Arrival date & time 04/01/11  1217   First MD Initiated Contact with Patient 04/01/11 1502      Chief Complaint  Patient presents with  . Medical Clearance    HPI Comments: Pt. Denies any suicidal ideation towards herself or others.  Patient is a 41 y.o. female presenting with anxiety. The history is provided by the patient. No language interpreter was used.  Anxiety This is a new problem. The current episode started more than 1 week ago. The problem occurs rarely. The problem has not changed since onset.Pertinent negatives include no chest pain and no abdominal pain. The symptoms are aggravated by nothing. The symptoms are relieved by nothing. She has tried nothing for the symptoms.    CERINA LEARY is a 41 y.o. female who presents to the Emergency Department complaining of severe, episodic anxiety.  Also reports drug use as well - mostly crack cocaine.  Denies SI at this time  Pt states that "she got to see tommy" and needs to go to [facility]   Past Medical History  Diagnosis Date  . Chronic pain   . Anxiety     Past Surgical History  Procedure Date  . Ankle surgery     Family History  Problem Relation Age of Onset  . Diabetes Mother   . Hypertension Sister     History  Substance Use Topics  . Smoking status: Current Everyday Smoker    Types: Cigarettes  . Smokeless tobacco: Not on file  . Alcohol Use: No    OB History    Grav Para Term Preterm Abortions TAB SAB Ect Mult Living                  Review of Systems  Cardiovascular: Negative for chest pain.  Gastrointestinal: Negative for abdominal pain.  All other systems reviewed and are negative.    Allergies  Review of patient's allergies indicates no known allergies.  Home Medications   Current Outpatient Rx  Name  Route Sig Dispense Refill  . HYDROCODONE-ACETAMINOPHEN 7.5-325 MG PO TABS Oral Take 1 tablet by mouth every 6 (six) hours as needed.        BP 98/55  Pulse 65  Temp(Src) 98.2 F (36.8 C) (Oral)  Resp 20  Ht 5\' 2"  (1.575 m)  Wt 106 lb (48.081 kg)  BMI 19.39 kg/m2  SpO2 100%  LMP 02/26/2011  Physical Exam  CONSTITUTIONAL: Well developed/well nourished HEAD AND FACE: Normocephalic/atraumatic EYES: EOMI/PERRL ENMT: Mucous membranes moist NECK: supple no meningeal signs SPINE:entire spine nontender CV: S1/S2 noted, no murmurs/rubs/gallops noted LUNGS: Lungs are clear to auscultation bilaterally, no apparent distress ABDOMEN: soft, nontender, no rebound or guarding GU:no cva tenderness NEURO: Pt is awake/alert, moves all extremitiesx4 EXTREMITIES: pulses normal, full ROM SKIN: warm, color normal PSYCH: no abnormalities of mood noted   ED Course  Procedures  DIAGNOSTIC STUDIES: Oxygen Saturation is 100% on room air, normal by my interpretation.    COORDINATION OF CARE:    Results for orders placed during the hospital encounter of 04/01/11  CBC      Component Value Range   WBC 3.7 (*) 4.0 - 10.5 (K/uL)   RBC 3.81 (*) 3.87 - 5.11 (MIL/uL)   Hemoglobin 11.4 (*) 12.0 - 15.0 (g/dL)   HCT 40.9 (*)  36.0 - 46.0 (%)   MCV 90.6  78.0 - 100.0 (fL)   MCH 29.9  26.0 - 34.0 (pg)   MCHC 33.0  30.0 - 36.0 (g/dL)   RDW 40.9  81.1 - 91.4 (%)   Platelets 195  150 - 400 (K/uL)  DIFFERENTIAL      Component Value Range   Neutrophils Relative 44  43 - 77 (%)   Neutro Abs 1.7  1.7 - 7.7 (K/uL)   Lymphocytes Relative 47 (*) 12 - 46 (%)   Lymphs Abs 1.8  0.7 - 4.0 (K/uL)   Monocytes Relative 8  3 - 12 (%)   Monocytes Absolute 0.3  0.1 - 1.0 (K/uL)   Eosinophils Relative 1  0 - 5 (%)   Eosinophils Absolute 0.0  0.0 - 0.7 (K/uL)   Basophils Relative 0  0 - 1 (%)   Basophils Absolute 0.0  0.0 - 0.1 (K/uL)  BASIC METABOLIC PANEL      Component Value Range   Sodium 142  135 - 145  (mEq/L)   Potassium 3.0 (*) 3.5 - 5.1 (mEq/L)   Chloride 106  96 - 112 (mEq/L)   CO2 28  19 - 32 (mEq/L)   Glucose, Bld 95  70 - 99 (mg/dL)   BUN 8  6 - 23 (mg/dL)   Creatinine, Ser 7.82  0.50 - 1.10 (mg/dL)   Calcium 9.4  8.4 - 95.6 (mg/dL)   GFR calc non Af Amer >90  >90 (mL/min)   GFR calc Af Amer >90  >90 (mL/min)  URINALYSIS, ROUTINE W REFLEX MICROSCOPIC      Component Value Range   Color, Urine YELLOW  YELLOW    APPearance CLEAR  CLEAR    Specific Gravity, Urine >1.030 (*) 1.005 - 1.030    pH 6.0  5.0 - 8.0    Glucose, UA NEGATIVE  NEGATIVE (mg/dL)   Hgb urine dipstick NEGATIVE  NEGATIVE    Bilirubin Urine NEGATIVE  NEGATIVE    Ketones, ur NEGATIVE  NEGATIVE (mg/dL)   Protein, ur NEGATIVE  NEGATIVE (mg/dL)   Urobilinogen, UA 0.2  0.0 - 1.0 (mg/dL)   Nitrite NEGATIVE  NEGATIVE    Leukocytes, UA NEGATIVE  NEGATIVE   URINE RAPID DRUG SCREEN (HOSP PERFORMED)      Component Value Range   Opiates NONE DETECTED  NONE DETECTED    Cocaine POSITIVE (*) NONE DETECTED    Benzodiazepines NONE DETECTED  NONE DETECTED    Amphetamines NONE DETECTED  NONE DETECTED    Tetrahydrocannabinol POSITIVE (*) NONE DETECTED    Barbiturates NONE DETECTED  NONE DETECTED      3:28PMPM- EDP at bedside discusses treatment plan. Pt without SI, reports relapse with crack seen by ACT, stable for d/c home and f/u tomorrow  MDM  Nursing notes reviewed and considered in documentation All labs/vitals reviewed and considered      I personally performed the services described in this documentation, which was scribed in my presence. The recorded information has been reviewed and considered.       Tina Gaskins, MD 04/01/11 [phone]

## 2011-06-23 ENCOUNTER — Encounter (HOSPITAL_COMMUNITY): Payer: Self-pay

## 2011-06-23 ENCOUNTER — Emergency Department (HOSPITAL_COMMUNITY)
Admission: EM | Admit: 2011-06-23 | Discharge: 2011-06-23 | Disposition: A | Discharge status: Home / Self Care | Payer: Self-pay | Attending: Emergency Medicine | Admitting: Emergency Medicine

## 2011-06-23 DIAGNOSIS — G8929 Other chronic pain: Secondary | ICD-10-CM | POA: Insufficient documentation

## 2011-06-23 DIAGNOSIS — M25579 Pain in unspecified ankle and joints of unspecified foot: Secondary | ICD-10-CM | POA: Insufficient documentation

## 2011-06-23 DIAGNOSIS — M25571 Pain in right ankle and joints of right foot: Secondary | ICD-10-CM

## 2011-06-23 DIAGNOSIS — Z760 Encounter for issue of repeat prescription: Secondary | ICD-10-CM | POA: Insufficient documentation

## 2011-06-23 MED ORDER — HYDROCODONE-ACETAMINOPHEN 5-325 MG PO TABS
1.0000 | ORAL_TABLET | Freq: Once | ORAL | Status: AC
  Administered 2011-06-23: 1 via ORAL
  Filled 2011-06-23: qty 1

## 2011-06-23 MED ORDER — HYDROCODONE-ACETAMINOPHEN 5-325 MG PO TABS: 1.0000 | ORAL_TABLET | Freq: Four times a day (QID) | ORAL | Status: AC | PRN
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
## 2011-06-23 NOTE — ED Notes (Signed)
Pt a/ox4. Resp even and unlabored. NAD at this time. D/C instructions reviewed with pt. Pt verbalized understanding. Pt ambulated to d/c desk.

## 2011-06-23 NOTE — ED Notes (Signed)
Complain of pain in right leg and ankle

## 2011-06-23 NOTE — ED Notes (Signed)
Pt presents with pain to right leg and ankle. Pulse palp, skin WDI, and sensation intact. Pt ambulated with normal steady gate. Pt has full ROM. Will continue to monitor.

## 2011-06-23 NOTE — Discharge Instructions (Signed)
Ankle Pain Ankle pain is a common symptom. The bones, cartilage, tendons, and muscles of the ankle joint perform a lot of work each day. The ankle joint holds your body weight and allows you to move around. Ankle pain can occur on either side or back of 1 or both ankles. Ankle pain may be sharp and burning or dull and aching. There may be tenderness, stiffness, redness, or warmth around the ankle. The pain occurs more often when a person walks or puts pressure on the ankle. CAUSES  There are many reasons ankle pain can develop. It is important to work with your caregiver to identify the cause since many conditions can impact the bones, cartilage, muscles, and tendons. Causes for ankle pain include:  Injury, including a break (fracture), sprain, or strain often due to a fall, sports, or a high-impact activity.   Swelling (inflammation) of a tendon (tendonitis).   Achilles tendon rupture.   Ankle instability after repeated sprains and strains.   Poor foot alignment.   Pressure on a nerve (tarsal tunnel syndrome).   Arthritis in the ankle or the lining of the ankle.   Crystal formation in the ankle (gout or pseudogout).  DIAGNOSIS  A diagnosis is based on your medical history, your symptoms, results of your physical exam, and results of diagnostic tests. Diagnostic tests may include X-ray exams or a computerized magnetic scan (magnetic resonance imaging, MRI). TREATMENT  Treatment will depend on the cause of your ankle pain and may include:  Keeping pressure off the ankle and limiting activities.   Using crutches or other walking support (a cane or brace).   Using rest, ice, compression, and elevation.   Participating in physical therapy or home exercises.   Wearing shoe inserts or special shoes.   Losing weight.   Taking medications to reduce pain or swelling or receiving an injection.   Undergoing surgery.  HOME CARE INSTRUCTIONS   Only take over-the-counter or prescription  medicines for pain, discomfort, or fever as directed by your caregiver.   Put ice on the injured area.   Put ice in a plastic bag.   Place a towel between your skin and the bag.   Leave the ice on for 15 to 20 minutes at a time, 3 to 4 times a day.   Keep your leg raised (elevated) when possible to lessen swelling.   Avoid activities that cause ankle pain.   Follow specific exercises as directed by your caregiver.   Record how often you have ankle pain, the location of the pain, and what it feels like. This information may be helpful to you and your caregiver.   Ask your caregiver about returning to work or sports and whether you should drive.   Follow up with your caregiver for further examination, therapy, or testing as directed.  SEEK MEDICAL CARE IF:   Pain or swelling continues or worsens beyond 1 week.   You have an oral temperature above 102 F (38.9 C).   You are feeling unwell or have chills.   You are having an increasingly difficult time with walking.   You have loss of sensation or other new symptoms.   You have questions or concerns.  MAKE SURE YOU:   Understand these instructions.   Will watch your condition.   Will get help right away if you are not doing well or get worse.  Document Released: 08/01/2009 Document Revised: 01/31/2011 Document Reviewed: 08/01/2009 [facility] Patient Information 2012 [place], [place].   Follow  up with dr. Hilda Lias as planned.

## 2011-06-24 NOTE — ED Provider Notes (Signed)
Medical screening examination/treatment/procedure(s) were performed by non-physician practitioner and as supervising physician I was immediately available for consultation/collaboration.  Nicoletta Dress. Colon Branch, MD 06/24/11 1610

## 2013-04-27 ENCOUNTER — Encounter (HOSPITAL_COMMUNITY): Payer: Self-pay | Admitting: Emergency Medicine

## 2013-04-27 ENCOUNTER — Emergency Department (HOSPITAL_COMMUNITY)
Admission: EM | Admit: 2013-04-27 | Discharge: 2013-04-27 | Disposition: A | Discharge status: Home / Self Care | Payer: Self-pay | Attending: Emergency Medicine | Admitting: Emergency Medicine

## 2013-04-27 ENCOUNTER — Emergency Department (HOSPITAL_COMMUNITY): Payer: Self-pay

## 2013-04-27 DIAGNOSIS — G8929 Other chronic pain: Secondary | ICD-10-CM | POA: Insufficient documentation

## 2013-04-27 DIAGNOSIS — R0789 Other chest pain: Secondary | ICD-10-CM

## 2013-04-27 DIAGNOSIS — F17200 Nicotine dependence, unspecified, uncomplicated: Secondary | ICD-10-CM | POA: Insufficient documentation

## 2013-04-27 DIAGNOSIS — F411 Generalized anxiety disorder: Secondary | ICD-10-CM | POA: Insufficient documentation

## 2013-04-27 DIAGNOSIS — R071 Chest pain on breathing: Secondary | ICD-10-CM | POA: Insufficient documentation

## 2013-04-27 MED ORDER — IBUPROFEN 400 MG PO TABS
600.0000 mg | ORAL_TABLET | Freq: Once | ORAL | Status: AC
  Administered 2013-04-27: 600 mg via ORAL
  Filled 2013-04-27: qty 2

## 2013-04-27 MED ORDER — TRAMADOL HCL 50 MG PO TABS
50.0000 mg | ORAL_TABLET | Freq: Four times a day (QID) | ORAL | Status: DC | PRN
Start: 2013-04-27 — End: 2014-06-15

## 2013-04-27 MED ORDER — OXYCODONE-ACETAMINOPHEN 5-325 MG PO TABS
2.0000 | ORAL_TABLET | Freq: Once | ORAL | Status: AC
  Administered 2013-04-27: 2 via ORAL
  Filled 2013-04-27: qty 2

## 2013-04-27 NOTE — Discharge Instructions (Signed)
Chest Wall Pain °Chest wall pain is pain in or around the bones and muscles of your chest. It may take up to 6 weeks to get better. It may take longer if you must stay physically active in your work and activities.  °CAUSES  °Chest wall pain may happen on its own. However, it may be caused by: °· A viral illness like the flu. °· Injury. °· Coughing. °· Exercise. °· Arthritis. °· Fibromyalgia. °· Shingles. °HOME CARE INSTRUCTIONS  °· Avoid overtiring physical activity. Try not to strain or perform activities that cause pain. This includes any activities using your chest or your abdominal and side muscles, especially if heavy weights are used. °· Put ice on the sore area. °· Put ice in a plastic bag. °· Place a towel between your skin and the bag. °· Leave the ice on for 15-20 minutes per hour while awake for the first 2 days. °· Only take over-the-counter or prescription medicines for pain, discomfort, or fever as directed by your caregiver. °SEEK IMMEDIATE MEDICAL CARE IF:  °· Your pain increases, or you are very uncomfortable. °· You have a fever. °· Your chest pain becomes worse. °· You have new, unexplained symptoms. °· You have nausea or vomiting. °· You feel sweaty or lightheaded. °· You have a cough with phlegm (sputum), or you cough up blood. °MAKE SURE YOU:  °· Understand these instructions. °· Will watch your condition. °· Will get help right away if you are not doing well or get worse. °Document Released: 02/11/2005 Document Revised: 05/06/2011 Document Reviewed: 10/08/2010 °ExitCare® Patient Information ©2014 [employer]. °  Emergency Department Resource Guide °1) Find a Doctor and Pay Out of Pocket °Although you won't have to find out who is covered by your insurance plan, it is a good idea to ask around and get recommendations. You will then need to call the office and see if the doctor you have chosen will accept you as a new patient and what types of options they offer for patients who are self-pay.  Some doctors offer discounts or will set up payment plans for their patients who do not have insurance, but you will need to ask so you aren't surprised when you get to your appointment. ° °2) Contact Your Local Health Department °Not all health departments have doctors that can see patients for sick visits, but many do, so it is worth a call to see if yours does. If you don't know where your local health department is, you can check in your phone book. The CDC also has a tool to help you locate your state's health department, and many state websites also have listings of all of their local health departments. ° °3) Find a Walk-in Clinic °If your illness is not likely to be very severe or complicated, you may want to try a walk in clinic. These are popping up all over the country in pharmacies, drugstores, and shopping centers. They're usually staffed by nurse practitioners or physician assistants that have been trained to treat common illnesses and complaints. They're usually fairly quick and inexpensive. However, if you have serious medical issues or chronic medical problems, these are probably not your best option. ° °No Primary Care Doctor: °- Call [facility] at  832-8000 - they can help you locate a primary care doctor that  accepts your insurance, provides certain services, etc. °- Physician Referral Service- 1-800-533-3463 ° °Chronic Pain Problems: °Organization         Address  Phone     Notes  °[facility]  (336) 297-2271 Patients need to be referred by their primary care doctor.  ° °Medication Assistance: °Organization         Address  Phone   Notes  °[place] [facility] [address] °[place], [place] [address] (336) 641-8030 --Must be a resident of [place] °-- Must have NO insurance coverage whatsoever (no Medicaid/ Medicare, etc.) °-- The pt. MUST have a primary care doctor that directs their care regularly and follows them in the  community °  °[facility]  (866) 331-1348   °[employer]  (888) 892-1162   ° °Agencies that provide inexpensive medical care: °Organization         Address  Phone   Notes  °[facility]  (336) 832-8035   °[facility]    (336) 832-7272   °[facility] [address] °[place], [place] [address] (336) 832-4777   °[facility] [address], °[place] (336) 271-4999   °[facility]    (336) 373-0678   °[facility]    (336) 272-1050   °[facility] ° [address], [place] Phone:  (336) 832-4444, Fax:  (336) 832-4440 Hours of Operation:  9 am - 6 pm, M-F.  Also accepts Medicaid/Medicare and self-pay.  °[facility] ° [address], [place] Phone: (336) 832-3150, Fax: (336) 832-3151. Hours of Operation:  8:30 am - 5:30 pm, M-F.  Also accepts Medicaid and self-pay.  °[facility] [place] [address], [place] Phone: (336) 878-6027   °[facility] [address], [place], [place] (336)723-1848, Ext. 123 Mondays & Thursdays: 7-9 AM.  First 15 patients are seen on a first come, first serve basis. °  ° °Medicaid-accepting [place] Providers: ° °Organization         Address  Phone   Notes  °[facility] [address], [place] (336) 641-2100 Also accepts self-pay patients.  °[facility] [address], [place] ° (336) 856-9996   °[facility] [address], [place] (336) 288-8857   °[facility] [address] [place] [address], [place] (336) 299-7000   °Veita Bland [address], [place]  ° (336) 373-1557 Only accepts [place] Access Medicaid patients after they have their name applied to their card.  ° °Self-Pay (no insurance) in [place]: ° °Organization         Address  Phone   Notes  °[facility] [address], [place] (336) 832-1970   °[facility] [address], [place] (336) 832-4400   °[facility] ° [address], [place] (336) 992-4800   °[facility]/Dr. Osei-Bonsu ° [address] [place] [address], [place] or [address], [place] (336) 841-8500 Phone number for both [place] and [place] locations is the same.  °[facility] [address], [place] (336) 299-0000   °[facility] [address] [place] [address], [place] or [address] (336) 852-7530 °(336) 878-2260   °[facility] [address],  (336) 350-1642, phone; (336) 294-5005, fax Sees patients 1st and 3rd Saturday of every month.  Must not qualify for public or private insurance (  i.e. Medicaid, Medicare, [place] Health Choice, Veterans' Benefits) • Household income should be no more than 200% of the poverty level •The clinic cannot treat you if you are pregnant or think you are pregnant • Sexually transmitted diseases are not treated at the clinic.  ° °Dental Care: °Organization         Address  Phone  Notes  °[place] [facility] [address], [place] (336) 641-6152 Accepts children up to age 21 who are enrolled in Medicaid or [place] Health Choice; pregnant women with a Medicaid card; and children who have applied for Medicaid or [place] Health Choice, but were declined, whose parents can pay a reduced fee at time of service.  °[facility] [place]  [address], [place] (336) 641-7733 Accepts children up to age 21 who are enrolled in Medicaid or [place] Health Choice; pregnant women with a Medicaid card; and children who have applied for Medicaid or [place] Health Choice, but were declined, whose parents can pay a reduced fee at time of service.  °[facility] ° [address], [place] (336) 641-4533 Patients are seen by appointment only. Walk-ins are not accepted. [facility] will see patients 18 years of age and older. °Monday - Tuesday (8am-5pm) °Most Wednesdays (8:30-5pm) °$30 per visit, cash only  °[facility] ° [address], [place] (336) 641-4533 Patients are seen by appointment only. Walk-ins are not accepted. [facility] will see patients 18 years of age and older. °One Wednesday Evening (Monthly: Volunteer Based).  $30 per visit, cash only  °[facility]  (919) 537-3737 for adults; Children under age 4, call Graduate Pediatric Dentistry at (919) [phone]. Children aged 4-14, please call (919) 537-3737 to request a pediatric application. ° Dental services are provided in all areas of dental care including fillings, crowns and bridges, complete and partial dentures, implants, gum treatment, root canals, and extractions. Preventive care is also provided. Treatment is provided to both adults and children. °Patients are selected via a lottery and there is often a waiting list. °  °[facility] [address], °[place] ° (336) 763-8833 [email or web address] °  °[facility] [address], [place], [place] (336)723-1848, Ext. 123 Second and Fourth Thursday of each month, opens at 6:30 AM; Clinic ends at 9 AM.  Patients are seen on a first-come first-served basis, and a limited number are seen during each clinic.  ° °[facility] ° [address], [place], [place] (336) 723-7904   Eligibility Requirements °You must have lived in [place], [place], or [place] counties for at least the last three months. °  You cannot be eligible for state or federal sponsored healthcare insurance, including Veterans Administration, Medicaid, or Medicare. °  You generally cannot be eligible for healthcare insurance through your employer.  °  How to apply: °Eligibility screenings are held every  Tuesday and Wednesday afternoon from 1:00 pm until 4:00 pm. You do not need an appointment for the interview!  °[facility] [address], [place], [place] 336-631-2330   °[facility]  336-342-8273   °[facility]  336-703-3100   °[facility]  336-570-6415   ° °Behavioral Health Resources in the Community: °Intensive Outpatient Programs °Organization         Address  Phone  Notes  °[facility] [address], [place], [place] 336-878-6098   °[facility] [address], [place], [place] 336-832-9800   °[facility] [address], [place], [place] ° 336-882-2125   °[facility] [address],  °[place], [place] 1-800-853-5163 or 336-641-4981   °Substance Abuse Resources °Organization         Address  Phone  Notes  °[facility]  336-882-2125   °[facility]  336-784-9470   °[facility]  336-285-9073   °[facility]  336-845-3988   °Residential & Outpatient Substance Abuse Program  1-800-659-3381   °Psychological Services °Organization         Address  Phone  Notes  °[facility]  336- 832-9600   °[facility]  336- 378-7881   °[facility] [address], [place] 1-800-853-5163 or 336-641-4981   ° °Mobile Crisis Teams °Organization         Address  Phone  Notes  °[facility], Mobile Crisis Care Unit  1-877-626-1772   °Assertive °[facility] ° [address] [place], [place] 336-834-9664   °Sharon DeEsch [address] °[place] 336-554-5454   ° °Self-Help/Support Groups °Organization         Address  Phone             Notes  °[facility] - variety of support groups  336- 373-1402 Call for more information  °Narcotics Anonymous (NA), [facility] [address], °[place]  2 meetings at this location   ° °Residential Treatment Programs °Organization         Address  Phone  Notes  °[facility] [address],    °[place]  1-866-801-8205   °[facility] ° [address], [place], [place] 704-293-8524   °[facility] [address], [place] 336-845-3988 Admissions: 8am-3pm M-F  °[facility] [address]    °[place], [place] 336-841-1104   °[facility] [address], [place], [place] 336-379-7146   °[facility] [address]  °[place], [place] 336-285-9073   °[facility] Intensive Outpatient [address], [place], [place] 336-852-3033   °[facility] [address]  °[place], [place] 1-877-615-2722 or 336-784-9470   °[facility] [address] [place], [place] 336-227-7417 Accepts Medicaid  °[facility] [address]  °[place] 1-800-659-3381 Substance Abuse/Addiction Treatment  ° °[place] Behavioral Health Resources °Organization         Address  Phone  Notes  °[facility]  (888) 581-9988   °Julie Brannon, PhD [address] [place], [place]   (336) 349-5553 or (336) 951-0000   °[facility]   [address] °[place], [place] (336) 349-4454   °[facility] [address], [place], [place] (336) 342-8316 Insurance/Medicaid/sponsorship through [facility]  °[facility] [address]                                    [place], [place] (336) 342-8316 Therapy/tele-psych/case  °[facility] [address]  ° [place],  (336) 349-2233    °Dr. Arfeen  (336) 349-4544   °[facility] [place]  [employer] [place] [facility] 1) [address],  °2) [address], [place] °3)  [address], [place] (336) 349-3220 °(336) 342-7768 ° °(336) 342-8140   °[place] [facility] (336) 342-1394 or (336) 342-3537 (After  Hours)    ° °   °

## 2013-04-27 NOTE — ED Notes (Signed)
Pain rt flank for 2 days, and pain rt leg, nausea, no vomiting.  Alert,

## 2013-04-27 NOTE — ED Provider Notes (Signed)
CSN: [id number]     Arrival date & time 04/27/13  1221 History   First MD Initiated Contact with Patient 04/27/13 1517     Chief Complaint  Patient presents with  . Flank Pain     (Consider location/radiation/quality/duration/timing/severity/associated sxs/prior Treatment) HPI  43 year old female with pain along her right chest/right flank. Onset about 2 days ago. Denies trauma or strain. Pain is sharp. Worse with movement. No change respiration. No cough. No shortness of breath. No rash. No history similar symptoms. Denies or vomiting. No urinary complaints. No past history of kidney stones or biliary colic. Has tried taking ibuprofen with some relief.   Past Medical History  Diagnosis Date  . Chronic pain   . Anxiety    Past Surgical History  Procedure Laterality Date  . Ankle surgery     Family History  Problem Relation Age of Onset  . Diabetes Mother   . Hypertension Sister    History  Substance Use Topics  . Smoking status: Current Every Day Smoker    Types: Cigarettes  . Smokeless tobacco: Not on file  . Alcohol Use: No   OB History   Grav Para Term Preterm Abortions TAB SAB Ect Mult Living                 Review of Systems  All systems reviewed and negative, other than as noted in HPI.   Allergies  Review of patient's allergies indicates no known allergies.  Home Medications   Current Outpatient Rx  Name  Route  Sig  Dispense  Refill  . HYDROcodone-acetaminophen (NORCO) 7.5-325 MG per tablet   Oral   Take 1 tablet by mouth every 6 (six) hours as needed. For pain         . traMADol (ULTRAM) 50 MG tablet   Oral   Take 1 tablet (50 mg total) by mouth every 6 (six) hours as needed.   15 tablet   0    BP 103/54  Pulse 103  Temp(Src) 98 F (36.7 C)  Resp 20  Wt 144 lb (65.318 kg)  SpO2 95%  LMP 04/11/2013 Physical Exam  Nursing note and vitals reviewed. Constitutional: She appears well-developed and well-nourished. No distress.  HENT:   Head: Normocephalic and atraumatic.  Eyes: Conjunctivae are normal. Right eye exhibits no discharge. Left eye exhibits no discharge.  Neck: Neck supple.  Cardiovascular: Normal rate, regular rhythm and normal heart sounds.  Exam reveals no gallop and no friction rub.   No murmur heard. Pulmonary/Chest: Effort normal and breath sounds normal. No respiratory distress. She exhibits tenderness.  Tenderness oral on the right anterior costal margin. No overlying skin lesions.  Abdominal: Soft. She exhibits no distension. There is no tenderness.  Musculoskeletal: She exhibits no edema and no tenderness.  Neurological: She is alert.  Skin: Skin is warm and dry.  Psychiatric: She has a normal mood and affect. Her behavior is normal. Thought content normal.    ED Course  Procedures (including critical care time) Labs Review Labs Reviewed - No data to display Imaging Review Dg Ribs Unilateral W/chest Right  04/27/2013   CLINICAL DATA:  Right axillary rib pain  EXAM: RIGHT RIBS AND CHEST - 3+ VIEW  COMPARISON:  08/20/2010  FINDINGS: No fracture or other bone lesions are seen involving the ribs. There is no evidence of pneumothorax or pleural effusion. Both lungs are clear. Heart size and mediastinal contours are within normal limits.  IMPRESSION: Negative.   Electronically Signed  By: Ruel Favors M.D.   On: 04/27/2013 16:53     EKG Interpretation None      MDM   Final diagnoses:  Right-sided chest wall pain    43 year old female with right chest pain. Patient has tenderness along the right costal margin consistent with musculoskeletal etiology. Atypical for ACS. CXR without acute abnormality. No respiratory complaints. Plan symptomatic treatment. Return precautions discussed.    Raeford Razor, MD 04/30/13 [phone]

## 2013-04-27 NOTE — ED Notes (Signed)
Patient given discharge instruction, verbalized understand. Patient ambulatory out of the department.  

## 2014-06-15 ENCOUNTER — Emergency Department (HOSPITAL_COMMUNITY): Payer: Self-pay

## 2014-06-15 ENCOUNTER — Emergency Department (HOSPITAL_COMMUNITY)
Admission: EM | Admit: 2014-06-15 | Discharge: 2014-06-15 | Disposition: A | Discharge status: Home / Self Care | Payer: Self-pay | Attending: Emergency Medicine | Admitting: Emergency Medicine

## 2014-06-15 ENCOUNTER — Encounter (HOSPITAL_COMMUNITY): Payer: Self-pay

## 2014-06-15 DIAGNOSIS — Z720 Tobacco use: Secondary | ICD-10-CM | POA: Insufficient documentation

## 2014-06-15 DIAGNOSIS — M25571 Pain in right ankle and joints of right foot: Secondary | ICD-10-CM | POA: Insufficient documentation

## 2014-06-15 DIAGNOSIS — Z87828 Personal history of other (healed) physical injury and trauma: Secondary | ICD-10-CM | POA: Insufficient documentation

## 2014-06-15 DIAGNOSIS — Z9889 Other specified postprocedural states: Secondary | ICD-10-CM | POA: Insufficient documentation

## 2014-06-15 DIAGNOSIS — Z8659 Personal history of other mental and behavioral disorders: Secondary | ICD-10-CM | POA: Insufficient documentation

## 2014-06-15 DIAGNOSIS — G8929 Other chronic pain: Secondary | ICD-10-CM | POA: Insufficient documentation

## 2014-06-15 MED ORDER — NAPROXEN 500 MG PO TABS: 500.0000 mg | ORAL_TABLET | Freq: Two times a day (BID) | ORAL | Status: DC

## 2014-06-15 MED ORDER — TRAMADOL HCL 50 MG PO TABS: 50.0000 mg | ORAL_TABLET | Freq: Four times a day (QID) | ORAL | Status: DC | PRN

## 2014-06-15 MED ORDER — HYDROCODONE-ACETAMINOPHEN 5-325 MG PO TABS
1.0000 | ORAL_TABLET | Freq: Once | ORAL | Status: AC
Start: 2014-06-15 — End: 2014-06-15
  Administered 2014-06-15: 1 via ORAL
  Filled 2014-06-15: qty 1

## 2014-06-15 NOTE — Discharge Instructions (Signed)
We are treating your pain today, however, we will not be taking care of your chronic pain in the ED. You will need to follow up with a primary care doctor or with Dr. Hilda LiasKeeling for your chronic pain.

## 2014-06-15 NOTE — ED Provider Notes (Signed)
CSN: [id number]     Arrival date & time 06/15/14  1105 History   First MD Initiated Contact with Patient 06/15/14 1213     Chief Complaint  Patient presents with  . Ankle Pain     (Consider location/radiation/quality/duration/timing/severity/associated sxs/prior Treatment) Patient is a 44 y.o. female presenting with ankle pain.  Ankle Pain  Tina Trevino is a 44 y.o. female who presents to the ED for pain management. She states that a few years ago she injured her right ankle and had to have surgery. Dr. Hilda LiasKeeling did surgery and she has problems off and on since then. He usually prescribes Hydrocodone for her pain. She called the office but states that she has to pay when she goes to his office so she came to the ED to get Rx for pain medication. She denies any other problems today. She has not had an x-ray of the ankle since 2012.  Patient does not have PCP.  Past Medical History  Diagnosis Date  . Chronic pain   . Anxiety    Past Surgical History  Procedure Laterality Date  . Ankle surgery     Family History  Problem Relation Age of Onset  . Diabetes Mother   . Hypertension Sister    History  Substance Use Topics  . Smoking status: Current Every Day Smoker    Types: Cigarettes  . Smokeless tobacco: Not on file  . Alcohol Use: No   OB History    No data available     Review of Systems Negative except as stated in HPI   Allergies  Review of patient's allergies indicates no known allergies.  Home Medications   Prior to Admission medications   Medication Sig Start Date End Date Taking? Authorizing Provider  HYDROcodone-acetaminophen (NORCO) 7.5-325 MG per tablet Take 1 tablet by mouth every 6 (six) hours as needed for moderate pain.   Yes Historical Provider, MD  naproxen (NAPROSYN) 500 MG tablet Take 1 tablet (500 mg total) by mouth 2 (two) times daily. 06/15/14   Hope Orlene OchM Neese, NP  traMADol (ULTRAM) 50 MG tablet Take 1 tablet (50 mg total) by mouth every 6 (six)  hours as needed. 06/15/14   Hope Orlene OchM Neese, NP   BP 122/95 mmHg  Pulse 70  Temp(Src) 98 F (36.7 C) (Oral)  Resp 18  Wt 110 lb (49.896 kg)  SpO2 100%  LMP 06/08/2014 Physical Exam  Constitutional: She is oriented to person, place, and time. She appears well-developed and well-nourished.  HENT:  Head: Normocephalic.  Eyes: EOM are normal.  Neck: Neck supple.  Cardiovascular: Normal rate.   Pulmonary/Chest: Effort normal.  Musculoskeletal:       Right ankle: She exhibits normal range of motion, no swelling and normal pulse. Tenderness. Medial malleolus tenderness found. Achilles tendon normal.  Scaring noted from surgery. Pedal pulse 2+, adequate circulation.   Neurological: She is alert and oriented to person, place, and time. No cranial nerve deficit.  Skin: Skin is warm and dry.  Psychiatric: She has a normal mood and affect. Her behavior is normal.  Nursing note and vitals reviewed.   ED Course  Procedures (including critical care time) Labs Review Labs Reviewed - No data to display  Imaging Review Dg Ankle Complete Right  06/15/2014   CLINICAL DATA:  Ankle pain.  Prior ankle surgery.  EXAM: RIGHT ANKLE - COMPLETE 3+ VIEW  COMPARISON:  12/10/2010  FINDINGS: Prior surgical fixation of medial malleolus. Screw extends into the distal tibia  unchanged. There is a pin extending into the distal tibia extending through the posterior cortex. This is unchanged from the prior study. Chronic fracture is well-healed  No acute fracture or arthropathy.  Joint space is normal.  IMPRESSION: No acute abnormality and no change from 2012.   Electronically Signed   By: Marlan Palau M.D.   On: 06/15/2014 13:19     MDM  44 y.o. female with chronic pain to the right ankle. Stable for d/c without neurovascular compromise. Discussed with the patient in detail that we will treat her pain today, however we do not treat chronic pain in the ED and she will need to follow up with Dr. Hilda Lias, a PCP or pain  clinic for continued treatment of her chronic pain.   Final diagnoses:  Ankle pain, chronic, right      [facility], NP 06/16/14 1512  Benjiman Core, MD 06/17/14 [phone]

## 2014-06-15 NOTE — ED Notes (Signed)
Pt reports has history of surgery to R ankle and has flare ups of pain in r ankle.  Denies injury.  Reports pain x 2 weeks.

## 2014-06-15 NOTE — ED Notes (Signed)
Patient with no complaints at this time. Respirations even and unlabored. Skin warm/dry. Discharge instructions reviewed with patient at this time. Patient given opportunity to voice concerns/ask questions. Patient discharged at this time and left Emergency Department with steady gait.   

## 2017-03-23 ENCOUNTER — Emergency Department (HOSPITAL_COMMUNITY): Payer: Self-pay

## 2017-03-23 ENCOUNTER — Encounter (HOSPITAL_COMMUNITY): Payer: Self-pay

## 2017-03-23 ENCOUNTER — Emergency Department (HOSPITAL_COMMUNITY)
Admission: EM | Admit: 2017-03-23 | Discharge: 2017-03-23 | Disposition: A | Discharge status: Home / Self Care | Payer: Self-pay | Attending: Emergency Medicine | Admitting: Emergency Medicine

## 2017-03-23 ENCOUNTER — Other Ambulatory Visit: Payer: Self-pay

## 2017-03-23 DIAGNOSIS — Z79899 Other long term (current) drug therapy: Secondary | ICD-10-CM | POA: Insufficient documentation

## 2017-03-23 DIAGNOSIS — J40 Bronchitis, not specified as acute or chronic: Secondary | ICD-10-CM | POA: Insufficient documentation

## 2017-03-23 DIAGNOSIS — F17210 Nicotine dependence, cigarettes, uncomplicated: Secondary | ICD-10-CM | POA: Insufficient documentation

## 2017-03-23 MED ORDER — IBUPROFEN 800 MG PO TABS
800.0000 mg | ORAL_TABLET | Freq: Once | ORAL | Status: AC
  Administered 2017-03-23: 800 mg via ORAL
  Filled 2017-03-23: qty 1

## 2017-03-23 MED ORDER — PREDNISONE 20 MG PO TABS: 40.0000 mg | ORAL_TABLET | Freq: Every day | ORAL | 0 refills | Status: DC

## 2017-03-23 MED ORDER — AZITHROMYCIN 250 MG PO TABS: ORAL_TABLET | ORAL | 0 refills | Status: DC

## 2017-03-23 MED ORDER — ALBUTEROL SULFATE HFA 108 (90 BASE) MCG/ACT IN AERS
2.0000 | INHALATION_SPRAY | Freq: Once | RESPIRATORY_TRACT | Status: AC
  Administered 2017-03-23: 2 via RESPIRATORY_TRACT
  Filled 2017-03-23: qty 6.7

## 2017-03-23 NOTE — ED Notes (Signed)
Patient returned from xray.

## 2017-03-23 NOTE — Discharge Instructions (Signed)
1-2 puffs of the albuterol inhaler every 4-6 hours as needed.  Drink plenty of water.  Follow-up with the clinic listed or return to the ER for any worsening symptoms.  You will need to have a repeat CT of your chest in 12 months

## 2017-03-23 NOTE — ED Triage Notes (Addendum)
Patient reports of sore throat, body aches x1 week. Took tylenol, aleve with no relief. Denies n/v/d. Patient did not receive flu shot this year.

## 2017-03-23 NOTE — ED Provider Notes (Signed)
[facility] EMERGENCY DEPARTMENT Provider Note   CSN: [id number] Arrival date & time: 03/23/17  1133     History   Chief Complaint Chief Complaint  Patient presents with  . Sore Throat  . Generalized Body Aches    HPI Magda BernheimJanice F Embree is a 47 y.o. female.  HPI   Magda BernheimJanice F Heaslip is a 47 y.o. female who presents to the Emergency Department complaining of sore throat, cough, fever and chills, generalized body aches for 1 week.  She states the cough has been productive of dark colored sputum.  States she has been taking Tylenol and Aleve without relief.  Patient also states that she is homeless and currently sleeping on someone's porch.  Fever reported but patient unsure of max temperature.  She denies chest pain, shortness of breath, nausea vomiting, and dysuria.   Past Medical History:  Diagnosis Date  . Anxiety   . Chronic pain     There are no active problems to display for this patient.   Past Surgical History:  Procedure Laterality Date  . ANKLE SURGERY      OB History    No data available       Home Medications    Prior to Admission medications   Medication Sig Start Date End Date Taking? Authorizing Provider  azithromycin (ZITHROMAX) 250 MG tablet Take first 2 tablets together, then 1 every day until finished. 03/23/17   Stehanie Ekstrom, PA-C  HYDROcodone-acetaminophen (NORCO) 7.5-325 MG per tablet Take 1 tablet by mouth every 6 (six) hours as needed for moderate pain.    [provider]  naproxen (NAPROSYN) 500 MG tablet Take 1 tablet (500 mg total) by mouth 2 (two) times daily. 06/15/14   Janne NapoleonNeese, Hope M, NP  predniSONE (DELTASONE) 20 MG tablet Take 2 tablets (40 mg total) by mouth daily. 03/23/17   Jenae Tomasello, PA-C  traMADol (ULTRAM) 50 MG tablet Take 1 tablet (50 mg total) by mouth every 6 (six) hours as needed. 06/15/14   Janne NapoleonNeese, Hope M, NP    Family History Family History  Problem Relation Age of Onset  . Diabetes Mother   . Hypertension  Sister     Social History Social History   Tobacco Use  . Smoking status: Current Every Day Smoker    Types: Cigarettes  . Smokeless tobacco: Never Used  Substance Use Topics  . Alcohol use: No  . Drug use: No     Allergies   Patient has no known allergies.   Review of Systems Review of Systems  Constitutional: Positive for chills and fever. Negative for appetite change.  HENT: Positive for congestion and rhinorrhea. Negative for sore throat and trouble swallowing.   Respiratory: Positive for cough. Negative for chest tightness, shortness of breath and wheezing.   Cardiovascular: Negative for chest pain.  Gastrointestinal: Negative for abdominal pain, nausea and vomiting.  Genitourinary: Negative for dysuria.  Musculoskeletal: Positive for myalgias. Negative for arthralgias, back pain and neck pain.  Skin: Negative for rash.  Neurological: Negative for dizziness, weakness and numbness.  Hematological: Negative for adenopathy.  All other systems reviewed and are negative.    Physical Exam Updated Vital Signs BP 136/78 (BP Location: Right Arm)   Pulse 78   Temp 98.9 F (37.2 C) (Oral)   Resp 16   Ht 5' (1.524 m)   Wt 49.9 kg (110 lb)   LMP 03/17/2017   SpO2 99%   BMI 21.48 kg/m   Physical Exam  Constitutional: She is  oriented to person, place, and time. She appears well-developed and well-nourished. No distress.  HENT:  Head: Normocephalic and atraumatic.  Right Ear: Tympanic membrane and ear canal normal.  Left Ear: Tympanic membrane and ear canal normal.  Nose: Mucosal edema and rhinorrhea present.  Mouth/Throat: Uvula is midline and mucous membranes are normal. No uvula swelling. Posterior oropharyngeal erythema present. No oropharyngeal exudate or posterior oropharyngeal edema. No tonsillar exudate.  Eyes: EOM are normal. Pupils are equal, round, and reactive to light.  Neck: Normal range of motion, full passive range of motion without pain and phonation  normal. Neck supple.  Cardiovascular: Normal rate, regular rhythm and intact distal pulses.  No murmur heard. Pulmonary/Chest: Effort normal. No stridor. No respiratory distress. She has no rales. She exhibits no tenderness.  Coarse lungs sounds bilaterally.  No rales or wheezing  Abdominal: Soft. She exhibits no distension and no mass. There is no tenderness. There is no guarding.  Musculoskeletal: She exhibits no edema.  Lymphadenopathy:    She has no cervical adenopathy.  Neurological: She is alert and oriented to person, place, and time. She exhibits normal muscle tone. Coordination normal.  Skin: Skin is warm. Capillary refill takes less than 2 seconds. No rash noted.  Nursing note and vitals reviewed.    ED Treatments / Results  Labs (all labs ordered are listed, but only abnormal results are displayed) Labs Reviewed - No data to display  EKG  EKG Interpretation None       Radiology Dg Chest 2 View  Result Date: 03/23/2017 CLINICAL DATA:  Productive cough and fever over the last week. Some shortness of breath. EXAM: CHEST  2 VIEW COMPARISON:  04/27/2013.  08/20/2010. FINDINGS: Heart size is normal. Mediastinal shadows are normal. There is mild central bronchial thickening but no infiltrate, collapse or effusion. Question 1 cm spiculated density in the left lower lobe. I cannot see this on previous exams. Therefore, chest CT suggested to rule out mass lesion. No bone abnormality. IMPRESSION: Possible bronchitis.  No consolidation or collapse. 1 cm spiculated density in the left lower lobe projected through the heart worrisome for a pulmonary mass, not visible on prior exams. Chest CT suggested. Electronically Signed   By: Paulina Fusi M.D.   On: 03/23/2017 13:07   Ct Chest Wo Contrast  Result Date: 03/23/2017 CLINICAL DATA:  Abnormal chest radiograph question LEFT lower lobe nodule EXAM: CT CHEST WITHOUT CONTRAST TECHNIQUE: Multidetector CT imaging of the chest was performed  following the standard protocol without IV contrast. Sagittal and coronal MPR images reconstructed from axial data set. COMPARISON:  Chest radiograph 03/23/2017 FINDINGS: Cardiovascular: Aorta normal caliber. Heart unremarkable for technique. No pericardial effusion. Mediastinum/Nodes: Esophagus normal appearance. Base of cervical region unremarkable. Pain few normal sized mediastinal and BILATERAL axillary lymph nodes without thoracic adenopathy, hilar assessment limited by lack of IV contrast. Lungs/Pleura: Lungs hyperinflated but clear. No pulmonary infiltrate, pleural effusion or pneumothorax. 3 mm subpleural nodule LEFT upper lobe image 76. No additional pulmonary nodules. Upper Abdomen: Unremarkable Musculoskeletal: Normal appearance IMPRESSION: Hyperinflated lungs. No acute intrathoracic process or LEFT lower lobe nodule/mass identified. 3 mm nonspecific LEFT upper lobe nodule, does not correspond in size or position with the radiographic finding, recommendation below. No follow-up needed if patient is low-risk. Non-contrast chest CT can be considered in 12 months if patient is high-risk. This recommendation follows the consensus statement: Guidelines for Management of Incidental Pulmonary Nodules Detected on CT Images: From the Fleischner Society 2017; Radiology 2017; 284:228-243. Electronically Signed  By: Ulyses Southward M.D.   On: 03/23/2017 14:54    Procedures Procedures (including critical care time)  Medications Ordered in ED Medications  ibuprofen (ADVIL,MOTRIN) tablet 800 mg (800 mg Oral Given 03/23/17 1254)  albuterol (PROVENTIL HFA;VENTOLIN HFA) 108 (90 Base) MCG/ACT inhaler 2 puff (2 puffs Inhalation Given 03/23/17 1555)     Initial Impression / Assessment and Plan / ED Course  I have reviewed the triage vital signs and the nursing notes.  Pertinent labs & imaging results that were available during my care of the patient were reviewed by me and considered in my medical decision making  (see chart for details).     Pt non-toxic appearing.  Vitals reviewed.  Family members at bedside.  Pt has drank fluids, ate crackers and chips.  No respiratory distress noted.  No clincial suspicion for PE.  Reviewed CT results and discussed with pt. and advised of need for repeat imaging in 12 months. Pt agrees to plan, request referral info for local shelters and assistance programs.  Appears safe for d/c.  Albuterol MDI dispensed.   Final Clinical Impressions(s) / ED Diagnoses   Final diagnoses:  Bronchitis    ED Discharge Orders        Ordered    azithromycin (ZITHROMAX) 250 MG tablet     03/23/17 1531    predniSONE (DELTASONE) 20 MG tablet  Daily     03/23/17 1531       Pauline Aus, PA-C 03/23/17 1657    Samuel Jester, DO 03/26/17 1600

## 2017-03-23 NOTE — ED Notes (Signed)
Patient stats she had tylenol and aleve at 0800 this morning.

## 2017-03-23 NOTE — ED Notes (Signed)
Patient transported to X-ray 

## 2017-06-04 ENCOUNTER — Emergency Department (HOSPITAL_COMMUNITY)
Admission: EM | Admit: 2017-06-04 | Discharge: 2017-06-04 | Disposition: A | Discharge status: Home / Self Care | Payer: Self-pay | Attending: Emergency Medicine | Admitting: Emergency Medicine

## 2017-06-04 ENCOUNTER — Other Ambulatory Visit: Payer: Self-pay

## 2017-06-04 ENCOUNTER — Encounter (HOSPITAL_COMMUNITY): Payer: Self-pay | Admitting: *Deleted

## 2017-06-04 DIAGNOSIS — Z5321 Procedure and treatment not carried out due to patient leaving prior to being seen by health care provider: Secondary | ICD-10-CM | POA: Insufficient documentation

## 2017-06-04 DIAGNOSIS — M79661 Pain in right lower leg: Secondary | ICD-10-CM | POA: Insufficient documentation

## 2017-06-04 NOTE — ED Triage Notes (Signed)
Pt c/o right lower leg pain; pt states she has had surgery to that leg and has pins and screws

## 2020-06-06 ENCOUNTER — Other Ambulatory Visit: Payer: Self-pay

## 2020-06-06 ENCOUNTER — Encounter (HOSPITAL_COMMUNITY): Payer: Self-pay | Admitting: Emergency Medicine

## 2020-06-06 ENCOUNTER — Emergency Department (HOSPITAL_COMMUNITY)
Admission: EM | Admit: 2020-06-06 | Discharge: 2020-06-06 | Disposition: A | Discharge status: Home / Self Care | Payer: Self-pay | Attending: Emergency Medicine | Admitting: Emergency Medicine

## 2020-06-06 DIAGNOSIS — R519 Headache, unspecified: Secondary | ICD-10-CM | POA: Insufficient documentation

## 2020-06-06 DIAGNOSIS — Z5321 Procedure and treatment not carried out due to patient leaving prior to being seen by health care provider: Secondary | ICD-10-CM | POA: Insufficient documentation

## 2020-06-06 DIAGNOSIS — M62831 Muscle spasm of calf: Secondary | ICD-10-CM | POA: Insufficient documentation

## 2020-06-06 NOTE — ED Notes (Signed)
Pt got in car outside and left ED parking lot

## 2020-06-06 NOTE — ED Triage Notes (Signed)
Having headaches on and off for past 2 months.  Muscle spasms in bilateral legs that has been going on for years.

## 2021-11-20 ENCOUNTER — Telehealth: Payer: Self-pay | Admitting: Orthopaedic Surgery

## 2021-11-20 NOTE — Telephone Encounter (Signed)
Patient called to ask about scheduling with Dr Tina Trevino for same ankle. Offered appointment dates - patient said she's waiting on new insurance which will be in effect November 25, 2021; requests to call back.

## 2022-05-18 DIAGNOSIS — Z0000 Encounter for general adult medical examination without abnormal findings: Secondary | ICD-10-CM | POA: Insufficient documentation

## 2022-05-19 ENCOUNTER — Encounter (HOSPITAL_COMMUNITY): Payer: Self-pay

## 2022-05-19 ENCOUNTER — Emergency Department (HOSPITAL_COMMUNITY)
Admission: EM | Admit: 2022-05-19 | Discharge: 2022-05-19 | Payer: Self-pay | Attending: Emergency Medicine | Admitting: Emergency Medicine

## 2022-05-19 ENCOUNTER — Other Ambulatory Visit: Payer: Self-pay

## 2022-05-19 DIAGNOSIS — Z0000 Encounter for general adult medical examination without abnormal findings: Secondary | ICD-10-CM
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
## 2022-05-19 NOTE — ED Notes (Signed)
ED Provider at bedside. 

## 2022-05-19 NOTE — Discharge Instructions (Signed)
Patient medically clear for jail
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
## 2023-10-08 ENCOUNTER — Other Ambulatory Visit (INDEPENDENT_AMBULATORY_CARE_PROVIDER_SITE_OTHER)

## 2023-10-08 ENCOUNTER — Ambulatory Visit: Admitting: Orthopaedic Surgery

## 2023-10-08 ENCOUNTER — Ambulatory Visit (INDEPENDENT_AMBULATORY_CARE_PROVIDER_SITE_OTHER): Admitting: Orthopaedic Surgery

## 2023-10-08 ENCOUNTER — Encounter: Payer: Self-pay | Admitting: Orthopaedic Surgery

## 2023-10-08 VITALS — BP 137/82 | HR 68 | Ht 60.0 in | Wt 170.0 lb

## 2023-10-08 DIAGNOSIS — M25571 Pain in right ankle and joints of right foot: Secondary | ICD-10-CM | POA: Diagnosis not present

## 2023-10-08 MED ORDER — NAPROXEN 500 MG PO TABS: 500.0000 mg | ORAL_TABLET | Freq: Two times a day (BID) | ORAL | 5 refills | Status: DC

## 2023-10-08 MED ORDER — OXYCODONE-ACETAMINOPHEN 5-325 MG PO TABS: ORAL_TABLET | ORAL | 0 refills | Status: DC

## 2023-10-08 NOTE — Progress Notes (Signed)
 Subjective:    Patient ID: Tina Trevino, female    DOB: December 30, 1970, 53 y.o.   MRN: [id number]  HPI She has had right ankle pain over the last several months.  Years ago I did surgery for medial malleolus fracture.  She did well until recently.  She has no trauma, no redness, no swelling, no numbness.  It hurts more posterior to the medial malleolus.  She has a slight limp.   Review of Systems  Constitutional:  Positive for activity change.  Musculoskeletal:  Positive for arthralgias and gait problem.  All other systems reviewed and are negative. For Review of Systems, all other systems reviewed and are negative.  The following is a summary of the past history medically, past history surgically, known current medicines, social history and family history.  This information is gathered electronically by the computer from prior information and documentation.  I review this each visit and have found including this information at this point in the chart is beneficial and informative.   Past Medical History:  Diagnosis Date   Anxiety    Chronic pain     Past Surgical History:  Procedure Laterality Date   ANKLE SURGERY      Current Outpatient Medications on File Prior to Visit  Medication Sig Dispense Refill   azithromycin  (ZITHROMAX ) 250 MG tablet Take first 2 tablets together, then 1 every day until finished. 6 tablet 0   HYDROcodone -acetaminophen  (NORCO) 7.5-325 MG per tablet Take 1 tablet by mouth every 6 (six) hours as needed for moderate pain.     hydrOXYzine (ATARAX) 50 MG tablet Take 50 mg by mouth 2 (two) times daily.     predniSONE  (DELTASONE ) 20 MG tablet Take 2 tablets (40 mg total) by mouth daily. 10 tablet 0   traMADol  (ULTRAM ) 50 MG tablet Take 1 tablet (50 mg total) by mouth every 6 (six) hours as needed. 10 tablet 0   traZODone (DESYREL) 100 MG tablet Take 100 mg by mouth at bedtime.     No current facility-administered medications on file prior to visit.     Social History   Socioeconomic History   Marital status: Single    Spouse name: Not on file   Number of children: Not on file   Years of education: Not on file   Highest education level: Not on file  Occupational History   Not on file  Tobacco Use   Smoking status: Every Day    Current packs/day: 1.00    Types: Cigarettes   Smokeless tobacco: Never  Vaping Use   Vaping status: Never Used  Substance and Sexual Activity   Alcohol use: Yes   Drug use: Yes    Types: Cocaine, Marijuana   Sexual activity: Yes    Birth control/protection: None  Other Topics Concern   Not on file  Social History Narrative   Not on file   Social Drivers of Health   Financial Resource Strain: Not on file  Food Insecurity: Not on file  Transportation Needs: Not on file  Physical Activity: Not on file  Stress: Not on file  Social Connections: Not on file  Intimate Partner Violence: Not on file    Family History  Problem Relation Age of Onset   Diabetes Mother    Hypertension Sister     BP 137/82   Pulse 68   Ht 5' (1.524 m)   Wt 170 lb (77.1 kg)   BMI 33.20 kg/m   Body mass index is  33.2 kg/m.      Objective:   Physical Exam Vitals and nursing note reviewed. Exam conducted with a chaperone present.  Constitutional:      Appearance: She is well-developed.  HENT:     Head: Normocephalic and atraumatic.  Eyes:     Conjunctiva/sclera: Conjunctivae normal.     Pupils: Pupils are equal, round, and reactive to light.  Cardiovascular:     Rate and Rhythm: Normal rate and regular rhythm.  Pulmonary:     Effort: Pulmonary effort is normal.  Abdominal:     Palpations: Abdomen is soft.  Musculoskeletal:     Cervical back: Normal range of motion and neck supple.       Feet:  Skin:    General: Skin is warm and dry.  Neurological:     Mental Status: She is alert and oriented to person, place, and time.     Cranial Nerves: No cranial nerve deficit.     Motor: No abnormal  muscle tone.     Coordination: Coordination normal.     Deep Tendon Reflexes: Reflexes are normal and symmetric. Reflexes normal.  Psychiatric:        Behavior: Behavior normal.        Thought Content: Thought content normal.        Judgment: Judgment normal.   X-rays were done of the right ankle, reported separately.        Assessment & Plan:   Encounter Diagnosis  Name Primary?   Pain in right ankle and joints of right foot Yes   I will give Naprosyn  and pain medicine. Use Aspercreme, Biofreeze or Voltaren gel to the area.  I have reviewed the [place]  Controlled Substance Reporting System web site prior to prescribing narcotic medicine for this patient.  She may need PT.  Return in two weeks.  Call if any problem.  Precautions discussed.  Electronically Signed Lemond Stable, MD 8/13/20254:00 PM

## 2023-10-22 ENCOUNTER — Encounter: Payer: Self-pay | Admitting: Orthopaedic Surgery

## 2023-10-22 ENCOUNTER — Ambulatory Visit: Admitting: Orthopaedic Surgery

## 2023-10-22 VITALS — BP 165/95 | HR 67 | Ht 60.0 in | Wt 170.0 lb

## 2023-10-22 DIAGNOSIS — M25571 Pain in right ankle and joints of right foot: Secondary | ICD-10-CM

## 2023-10-22 MED ORDER — OXYCODONE-ACETAMINOPHEN 5-325 MG PO TABS: ORAL_TABLET | ORAL | 0 refills | Status: DC

## 2023-10-22 NOTE — Progress Notes (Signed)
 My ankle is still sore  She has continued soreness and some pain of the right ankle.  She is walking better however.  She has no new trauma.  I will begin PT.  ROM of the right ankle is full, no swelling, NV intact.  Encounter Diagnosis  Name Primary?   Pain in right ankle and joints of right foot Yes   I have reviewed the [place]  Controlled Substance Reporting System web site prior to prescribing narcotic medicine for this patient.  Return in two weeks.  Go to PT.  Call if any problem.  Precautions discussed.  Electronically Signed Lemond Stable, MD 8/27/20252:31 PM

## 2023-10-22 NOTE — Addendum Note (Signed)
 Addended byBETHA JENEAN GREIG LELON on: 10/22/2023 02:50 PM   Modules accepted: Orders

## 2023-10-22 NOTE — Patient Instructions (Signed)
 Note to be out of work for Two weeks.   Referral has been placed for Phsical therapy

## 2023-10-29 ENCOUNTER — Other Ambulatory Visit: Payer: Self-pay | Admitting: *Deleted

## 2023-10-29 DIAGNOSIS — M25571 Pain in right ankle and joints of right foot: Secondary | ICD-10-CM

## 2023-11-02 ENCOUNTER — Telehealth: Payer: Self-pay | Admitting: Orthopaedic Surgery

## 2023-11-02 NOTE — Telephone Encounter (Signed)
 Dr. Janae pt - Beth w/[facility] [phone] lvm on 10/31/23 after hours stating they received a referral for this pt and she has Medicaid and they do not accept Medicaid.  She stated that we need to send her somewhere else.

## 2023-11-03 NOTE — Telephone Encounter (Signed)
 I have already sent the referral to another PT office

## 2023-11-05 ENCOUNTER — Ambulatory Visit: Admitting: Orthopaedic Surgery

## 2023-11-05 ENCOUNTER — Encounter: Payer: Self-pay | Admitting: Orthopaedic Surgery

## 2023-11-05 VITALS — BP 165/95 | Ht 60.0 in | Wt 168.0 lb

## 2023-11-05 DIAGNOSIS — M25571 Pain in right ankle and joints of right foot: Secondary | ICD-10-CM

## 2023-11-05 MED ORDER — OXYCODONE-ACETAMINOPHEN 5-325 MG PO TABS: ORAL_TABLET | ORAL | 0 refills | Status: DC

## 2023-11-05 NOTE — Progress Notes (Signed)
 I am waiting for PT.  PT is backed up in seeing patients.  She is scheduled.  Her ankle pain is less.  She is walking a little better but it still hurts.  Right ankle has no swelling, no redness, gait is good.  NV intact.  Encounter Diagnosis  Name Primary?   Pain in right ankle and joints of right foot Yes   Go to PT.  I have informed the patient I will be retiring from medical practice and from this office on November 27, 2023.  The patient has been offered continuing care with Dr. Margrette or Dr. Onesimo of this office.  The patient may choose another provider and the records will be forwarded after proper signature and notification.  Patient understands and agrees.  She would like to see Dr. Onesimo.  Call if any problem.  Precautions discussed.  Return in one month.  Electronically Signed Lemond Stable, MD 9/10/20252:52 PM

## 2023-11-05 NOTE — Patient Instructions (Signed)
 Physical therapy has been ordered for you at [facility]. They should call you to schedule, [phone] is the phone number to call, if you want to call to schedule.

## 2023-11-15 ENCOUNTER — Other Ambulatory Visit: Payer: Self-pay

## 2023-11-15 ENCOUNTER — Encounter: Payer: Self-pay | Admitting: *Deleted

## 2023-11-15 ENCOUNTER — Emergency Department
Admission: EM | Admit: 2023-11-15 | Discharge: 2023-11-15 | Disposition: A | Discharge status: Home / Self Care | Attending: Emergency Medicine | Admitting: Emergency Medicine

## 2023-11-15 ENCOUNTER — Emergency Department

## 2023-11-15 DIAGNOSIS — G8929 Other chronic pain: Secondary | ICD-10-CM | POA: Insufficient documentation

## 2023-11-15 DIAGNOSIS — M25571 Pain in right ankle and joints of right foot: Secondary | ICD-10-CM | POA: Insufficient documentation

## 2023-11-15 LAB — RESP PANEL BY RT-PCR (RSV, FLU A&B, COVID)  RVPGX2
Influenza A by PCR: NEGATIVE
Influenza B by PCR: NEGATIVE
Resp Syncytial Virus by PCR: NEGATIVE
SARS Coronavirus 2 by RT PCR: NEGATIVE

## 2023-11-15 MED ORDER — OXYCODONE-ACETAMINOPHEN 5-325 MG PO TABS: 1.0000 | ORAL_TABLET | ORAL | 0 refills | Status: DC | PRN

## 2023-11-15 MED ORDER — NAPROXEN 500 MG PO TABS: 500.0000 mg | ORAL_TABLET | Freq: Two times a day (BID) | ORAL | 5 refills | Status: AC

## 2023-11-15 NOTE — ED Triage Notes (Signed)
 Pt with hx of ankle surgery is here for evaluation of right ankle pain.  Not associated with any recent trauma.  Pt also reports generalized fatigue and weakness for a week.

## 2023-11-15 NOTE — ED Provider Notes (Signed)
 [facility] Provider Note   Event Date/Time   First MD Initiated Contact with Patient 11/15/23 1337     (approximate) History  Ankle Pain  HPI Tina Trevino is a 53 y.o. female with a stated past medical history of right ankle surgery who presents for chronic right ankle pain.  Patient states that she is normally on oxycodone  for this pain however she has been unable to follow-up with her new orthopedic surgeon for refill of these medications.  Patient has not been on any pain management regimen in the past.  Patient states that she has been using over-the-counter medications with little relief in her pain.  Patient Dors is worsening with walking or palpation of the medial malleolus.  Patient denies any new trauma to this ankle ROS: Patient currently denies any vision changes, tinnitus, difficulty speaking, facial droop, sore throat, chest pain, shortness of breath, abdominal pain, nausea/vomiting/diarrhea, dysuria, or weakness/numbness/paresthesias in any extremity   Physical Exam  Triage Vital Signs: ED Triage Vitals [11/15/23 1252]  Encounter Vitals Group     BP (!) 144/86     Girls Systolic BP Percentile      Girls Diastolic BP Percentile      Boys Systolic BP Percentile      Boys Diastolic BP Percentile      Pulse Rate 68     Resp 16     Temp 98.3 F (36.8 C)     Temp Source Oral     SpO2 96 %     Weight      Height      Head Circumference      Peak Flow      Pain Score 10     Pain Loc      Pain Education      Exclude from Growth Chart    Most recent vital signs: Vitals:   11/15/23 1252  BP: (!) 144/86  Pulse: 68  Resp: 16  Temp: 98.3 F (36.8 C)  SpO2: 96%   General: Awake, oriented x4. CV:  Good peripheral perfusion. Resp:  Normal effort. Abd:  No distention. Other:  Middle-aged overweight African-American female resting comfortably in no acute distress.  Mild tenderness to palpation over the medial aspect of the right ankle with  full range of motion without pain ED Results / Procedures / Treatments  Labs (all labs ordered are listed, but only abnormal results are displayed) Labs Reviewed  RESP PANEL BY RT-PCR (RSV, FLU A&B, COVID)  [id number]  RADIOLOGY ED MD interpretation: X-ray of the right ankle shows no evidence of osseous abnormalities.  There is a medial malleolar screw and K wire that are in place - All radiology independently interpreted and agree with radiology assessment Official radiology report(s): DG Ankle Complete Right Result Date: 11/15/2023 EXAM: 3 or more VIEW(S) XRAY OF THE RIGHT ANKLE 11/15/2023 01:08:00 PM CLINICAL HISTORY: Chronic right ankle pain with intermittent swelling. Patient states no known recent injury. History of surgery to this ankle in the past. COMPARISON: 10/08/2023 FINDINGS: BONES AND JOINTS: Medial malleolar screw and K-wire in place. No acute fracture. No focal osseous lesion. No joint dislocation. SOFT TISSUES: The soft tissues are unremarkable. IMPRESSION: 1. No acute osseous abnormality. 2. Medial malleolar screw and K-wire in place. Electronically signed by: Waddell Calk MD 11/15/2023 02:11 PM EDT RP Workstation: [id number]   PROCEDURES: Critical Care performed: No Procedures MEDICATIONS ORDERED IN ED: Medications - No data to display IMPRESSION / MDM / ASSESSMENT AND PLAN /  ED COURSE  I reviewed the triage vital signs and the nursing notes.                             The patient is on the cardiac monitor to evaluate for evidence of arrhythmia and/or significant heart rate changes. Patient's presentation is most consistent with acute presentation with potential threat to life or bodily function. Patient is a 53 year old female with the above-stated past medical history presents for acute on chronic right ankle pain status post ORIF Given history, exam and workup I have low suspicion for fracture, dislocation, significant ligamentous injury, septic arthritis, gout flare, new  autoimmune arthropathy, or gonococcal arthropathy.  Interventions: Right ankle x-ray shows no evidence of acute abnormalities Disposition: Discharge home with strict return precautions and instructions for prompt primary care follow up in the next week.   FINAL CLINICAL IMPRESSION(S) / ED DIAGNOSES   Final diagnoses:  Chronic pain of right ankle   Rx / DC Orders   ED Discharge Orders          Ordered    Ambulatory referral to Pain Clinic        11/15/23 1527    oxyCODONE -acetaminophen  (PERCOCET/ROXICET) 5-325 MG tablet  Every 4 hours PRN        11/15/23 1533    naproxen  (NAPROSYN ) 500 MG tablet  2 times daily with meals        11/15/23 1533    oxyCODONE -acetaminophen  (PERCOCET/ROXICET) 5-325 MG tablet  Every 4 hours PRN        Pending    naproxen  (NAPROSYN ) 500 MG tablet  2 times daily with meals        Pending           Note:  This document was prepared using Dragon voice recognition software and may include unintentional dictation errors.   Jossie Artist POUR, MD 11/15/23 [phone]

## 2023-11-19 ENCOUNTER — Encounter: Payer: Self-pay | Admitting: Orthopaedic Surgery

## 2023-11-19 ENCOUNTER — Ambulatory Visit: Admitting: Orthopaedic Surgery

## 2023-11-19 VITALS — Ht 60.0 in | Wt 169.0 lb

## 2023-11-19 DIAGNOSIS — M25571 Pain in right ankle and joints of right foot: Secondary | ICD-10-CM | POA: Diagnosis not present

## 2023-11-19 MED ORDER — OXYCODONE-ACETAMINOPHEN 5-325 MG PO TABS: 1.0000 | ORAL_TABLET | ORAL | 0 refills | Status: AC | PRN

## 2023-11-19 NOTE — Progress Notes (Signed)
 My ankle is hurting.  She went to the ER this past week for right ankle pain.  I have reviewed the notes and X-rays.  I have independently reviewed and interpreted x-rays of this patient done at another site by another physician or qualified health professional.  She is many years post medial malleolus surgery.  The fracture is well healed.  She has no new trauma.  She hurts over the tip of the medial malleolus.  She has no redness.  X-rays show no loosening or backing up of the screw or K-wire.  NV intact.  She has no redness or swelling of the right medial ankle. ROM is full.  Normal gait.  Encounter Diagnosis  Name Primary?   Pain in right ankle and joints of right foot Yes   I told her that sometimes the screw will cause pain after years of being in the body.  It could be removed.  She has appointment already to see Dr. Onesimo in a few weeks.  I am retiring next week.  She is aware of this.  I have reviewed the [place]  Controlled Substance Reporting System web site prior to prescribing narcotic medicine for this patient.  Call if any problem.  Precautions discussed.  Electronically Signed Lemond Stable, MD 9/24/20259:43 AM

## 2023-12-03 ENCOUNTER — Ambulatory Visit: Admitting: Orthopedic Surgery

## 2023-12-08 ENCOUNTER — Ambulatory Visit: Payer: Self-pay
